# Patient Record
Sex: Female | Born: 1990 | Race: White | Hispanic: No | Marital: Married | Smoking: Never smoker
Health system: Southern US, Community
[De-identification: ages and names within clinical notes are randomized; demographics above are authoritative.]

## PROBLEM LIST (undated history)

## (undated) DIAGNOSIS — N189 Chronic kidney disease, unspecified: Secondary | ICD-10-CM

---

## 2012-09-15 NOTE — L&D Delivery Note (Signed)
Delivery Note At 3:53 AM a viable and healthy female was delivered via Vaginal, Spontaneous Delivery (Presentation: Right Occiput Anterior).  APGAR: 9, 9; weight .   Placenta status: Intact, Spontaneous.  Cord: 3 vessels with the following complications: None.  Cord pH: n/a  Anesthesia: Epidural  Episiotomy: None Lacerations: Labial (bilat) Suture Repair: 4.0 monocryl  Est. Blood Loss (mL): 400 *Due to increased bleeding after delivery pt given of cytotec rectally.  Mom to postpartum.  Baby to nursery-stable.  [facility] 03/05/2013, 5:05 AM

## 2012-09-15 NOTE — L&D Delivery Note (Signed)
Attestation of Attending Supervision of Advanced Practitioner (CNM/NP): Evaluation and management procedures were performed by the Advanced Practitioner under my supervision and collaboration. I have reviewed the Advanced Practitioner's note and chart, and I agree with the management and plan.  Efraim Vanallen H. 10:39 PM   

## 2012-11-04 LAB — OB RESULTS CONSOLE ABO/RH

## 2012-11-04 LAB — OB RESULTS CONSOLE HIV ANTIBODY (ROUTINE TESTING): HIV: NONREACTIVE

## 2012-11-04 LAB — OB RESULTS CONSOLE RPR: RPR: NONREACTIVE

## 2012-11-08 LAB — OB RESULTS CONSOLE GC/CHLAMYDIA: Gonorrhea: NEGATIVE

## 2012-12-06 LAB — GLUCOSE TOLERANCE, 1 HOUR (50G) W/O FASTING: Glucose, 1 Hour GTT: 131

## 2013-02-15 ENCOUNTER — Encounter: Payer: Self-pay | Admitting: *Deleted

## 2013-02-25 ENCOUNTER — Telehealth: Payer: Self-pay | Admitting: *Deleted

## 2013-02-25 NOTE — Telephone Encounter (Signed)
Pt left a message requesting that someone call her back. She just has a few questions.

## 2013-03-02 NOTE — Telephone Encounter (Signed)
Called pt and phone rang and then stated "that # you have dialed is incorrect please try call again."  Pt has appt scheduled with Dr. Marice Potter on 03/08/13 @ 0800.

## 2013-03-04 ENCOUNTER — Encounter (HOSPITAL_COMMUNITY): Payer: Self-pay | Admitting: Anesthesiology

## 2013-03-04 ENCOUNTER — Encounter (HOSPITAL_COMMUNITY): Payer: Self-pay | Admitting: *Deleted

## 2013-03-04 ENCOUNTER — Encounter (HOSPITAL_COMMUNITY): Payer: Self-pay

## 2013-03-04 ENCOUNTER — Inpatient Hospital Stay (HOSPITAL_COMMUNITY): Payer: Medicaid Other | Admitting: Anesthesiology

## 2013-03-04 ENCOUNTER — Inpatient Hospital Stay (HOSPITAL_COMMUNITY)
Admission: AD | Admit: 2013-03-04 | Discharge: 2013-03-06 | DRG: 775 | Disposition: A | Discharge status: Home / Self Care | Payer: Medicaid Other | Source: Ambulatory Visit | Attending: Obstetrics & Gynecology | Admitting: Obstetrics & Gynecology

## 2013-03-04 ENCOUNTER — Inpatient Hospital Stay (HOSPITAL_COMMUNITY)
Admission: AD | Admit: 2013-03-04 | Discharge: 2013-03-04 | Disposition: A | Discharge status: Home / Self Care | Payer: Medicaid Other | Source: Ambulatory Visit | Attending: Obstetrics and Gynecology | Admitting: Obstetrics and Gynecology

## 2013-03-04 DIAGNOSIS — IMO0001 Reserved for inherently not codable concepts without codable children: Secondary | ICD-10-CM

## 2013-03-04 DIAGNOSIS — O479 False labor, unspecified: Secondary | ICD-10-CM | POA: Insufficient documentation

## 2013-03-04 HISTORY — DX: Chronic kidney disease, unspecified: N18.9

## 2013-03-04 LAB — CBC
HCT: 34.4 % — ABNORMAL LOW (ref 36.0–46.0)
Hemoglobin: 11.9 g/dL — ABNORMAL LOW (ref 12.0–15.0)
MCHC: 34.6 g/dL (ref 30.0–36.0)
RBC: 4.38 MIL/uL (ref 3.87–5.11)

## 2013-03-04 LAB — RPR: RPR Ser Ql: NONREACTIVE

## 2013-03-04 LAB — OB RESULTS CONSOLE GBS: GBS: NEGATIVE

## 2013-03-04 LAB — GROUP B STREP BY PCR: Group B strep by PCR: NEGATIVE

## 2013-03-04 MED ORDER — PHENYLEPHRINE 40 MCG/ML (10ML) SYRINGE FOR IV PUSH (FOR BLOOD PRESSURE SUPPORT)
80.0000 ug | PREFILLED_SYRINGE | INTRAVENOUS | Status: DC | PRN
  Filled 2013-03-04: qty 2

## 2013-03-04 MED ORDER — ONDANSETRON HCL 4 MG/2ML IJ SOLN: 4.0000 mg | Freq: Four times a day (QID) | INTRAMUSCULAR | Status: DC | PRN

## 2013-03-04 MED ORDER — EPHEDRINE 5 MG/ML INJ
10.0000 mg | INTRAVENOUS | Status: DC | PRN
  Filled 2013-03-04: qty 2

## 2013-03-04 MED ORDER — OXYTOCIN 40 UNITS IN LACTATED RINGERS INFUSION - SIMPLE MED: 62.5000 mL/h | INTRAVENOUS | Status: DC

## 2013-03-04 MED ORDER — DIPHENHYDRAMINE HCL 50 MG/ML IJ SOLN: 12.5000 mg | INTRAMUSCULAR | Status: DC | PRN

## 2013-03-04 MED ORDER — LACTATED RINGERS IV SOLN
INTRAVENOUS | Status: DC
  Administered 2013-03-04 – 2013-03-05 (×3): via INTRAVENOUS

## 2013-03-04 MED ORDER — LACTATED RINGERS IV SOLN: 500.0000 mL | INTRAVENOUS | Status: DC | PRN

## 2013-03-04 MED ORDER — NALBUPHINE SYRINGE 5 MG/0.5 ML
5.0000 mg | INJECTION | INTRAMUSCULAR | Status: DC | PRN
  Filled 2013-03-04: qty 0.5

## 2013-03-04 MED ORDER — EPHEDRINE 5 MG/ML INJ
10.0000 mg | INTRAVENOUS | Status: DC | PRN
  Filled 2013-03-04: qty 4
  Filled 2013-03-04: qty 2

## 2013-03-04 MED ORDER — OXYCODONE-ACETAMINOPHEN 5-325 MG PO TABS: 1.0000 | ORAL_TABLET | ORAL | Status: DC | PRN

## 2013-03-04 MED ORDER — OXYTOCIN 40 UNITS IN LACTATED RINGERS INFUSION - SIMPLE MED: 1.0000 m[IU]/min | INTRAVENOUS | Status: DC

## 2013-03-04 MED ORDER — IBUPROFEN 600 MG PO TABS
600.0000 mg | ORAL_TABLET | Freq: Four times a day (QID) | ORAL | Status: DC | PRN
  Administered 2013-03-05: 600 mg via ORAL
  Filled 2013-03-04: qty 1

## 2013-03-04 MED ORDER — PHENYLEPHRINE 40 MCG/ML (10ML) SYRINGE FOR IV PUSH (FOR BLOOD PRESSURE SUPPORT)
80.0000 ug | PREFILLED_SYRINGE | INTRAVENOUS | Status: DC | PRN
  Filled 2013-03-04: qty 2
  Filled 2013-03-04: qty 5

## 2013-03-04 MED ORDER — FENTANYL 2.5 MCG/ML BUPIVACAINE 1/10 % EPIDURAL INFUSION (WH - ANES)
INTRAMUSCULAR | Status: DC | PRN
  Administered 2013-03-04: 14 mL/h via EPIDURAL

## 2013-03-04 MED ORDER — LIDOCAINE HCL (PF) 1 % IJ SOLN
30.0000 mL | INTRAMUSCULAR | Status: DC | PRN
  Filled 2013-03-04 (×3): qty 30

## 2013-03-04 MED ORDER — ACETAMINOPHEN 325 MG PO TABS: 650.0000 mg | ORAL_TABLET | ORAL | Status: DC | PRN

## 2013-03-04 MED ORDER — OXYTOCIN BOLUS FROM INFUSION
500.0000 mL | INTRAVENOUS | Status: DC
  Administered 2013-03-05: 500 mL via INTRAVENOUS

## 2013-03-04 MED ORDER — LACTATED RINGERS IV SOLN: 500.0000 mL | Freq: Once | INTRAVENOUS | Status: DC

## 2013-03-04 MED ORDER — FENTANYL 2.5 MCG/ML BUPIVACAINE 1/10 % EPIDURAL INFUSION (WH - ANES)
14.0000 mL/h | INTRAMUSCULAR | Status: DC | PRN
  Administered 2013-03-04: 14 mL/h via EPIDURAL
  Filled 2013-03-04 (×2): qty 125

## 2013-03-04 MED ORDER — CITRIC ACID-SODIUM CITRATE 334-500 MG/5ML PO SOLN: 30.0000 mL | ORAL | Status: DC | PRN

## 2013-03-04 MED ORDER — TERBUTALINE SULFATE 1 MG/ML IJ SOLN: 0.2500 mg | Freq: Once | INTRAMUSCULAR | Status: AC | PRN

## 2013-03-04 MED ORDER — LIDOCAINE HCL (PF) 1 % IJ SOLN
INTRAMUSCULAR | Status: DC | PRN
  Administered 2013-03-04 (×2): 8 mL

## 2013-03-04 NOTE — Progress Notes (Signed)
   Subjective: Reports continued comfort.  Declines pitocin, consents to AROM  Objective: BP 102/69  Pulse 99  Temp(Src) 98.3 F (36.8 C) (Oral)  Resp 20  Ht 5\' 4"  (1.626 m)  Wt 65.499 kg (144 lb 6.4 oz)  BMI 24.77 kg/m2  SpO2 100%  LMP 05/27/2012      FHT:  FHR: 120's bpm, variability: moderate,  accelerations:  Present,  decelerations:  Absent UC:   irregular, every 2-5 minutes SVE:   Dilation: 5 Effacement (%): 80 Station: -1 Exam by:: Dr Adriana Simas  Labs: Lab Results  Component Value Date   WBC 13.7* 03/04/2013   HGB 11.9* 03/04/2013   HCT 34.4* 03/04/2013   MCV 78.5 03/04/2013   PLT 227 03/04/2013    Assessment / Plan: Labor - Minimal Cervix Change  Labor: Labor - Minimal Cervix Change Preeclampsia:  n/a Fetal Wellbeing:  Category I Pain Control:  Epidural I/D:  n/a Anticipated MOD:  NSVD  [facility] 03/04/2013, 8:38 PM

## 2013-03-04 NOTE — Anesthesia Preprocedure Evaluation (Signed)

## 2013-03-04 NOTE — MAU Note (Signed)
Patient states she is having contractions every 3-5 minutes with an increase in vaginal discharge. Patient denies bleeding or leaking and reports good fetal movement. Was seen in MAU in the early am hours and was 2 cm.

## 2013-03-04 NOTE — H&P (Signed)
Margaret Gregory is a 22 y.o. female [redacted]w[redacted]d, G1P0 presenting for spontaneous onset labor. Denies complications during pregnancy.   Maternal Medical History:  Reason for admission: Contractions.  Nausea.  Contractions: Onset was yesterday.   Frequency: regular.   Perceived severity is strong.    Fetal activity: Perceived fetal activity is normal.   Last perceived fetal movement was within the past hour.    Prenatal complications: Bleeding: Reports passing small clot 2 days ago and small amount bloody discharge x 2 days.   Prenatal Complications - Diabetes: none.    OB History   Grav Para Term Preterm Abortions TAB SAB Ect Mult Living   1 0 0 0 0 0 0 0 0 0      Past Medical History  Diagnosis Date  . Chronic kidney disease     caused by apples and hot dogs   Past Surgical History  Procedure Laterality Date  . No past surgeries     Family History: family history is not on file. Social History:  reports that she has never smoked. She does not have any smokeless tobacco history on file. She reports that she does not drink alcohol or use illicit drugs.   Prenatal Transfer Tool  Maternal Diabetes: No Genetic Screening: Normal Maternal Ultrasounds/Referrals: Normal Fetal Ultrasounds or other Referrals:  None Maternal Substance Abuse:  No Significant Maternal Medications:  None Significant Maternal Lab Results:  None Other Comments:  None GBS neg Review of Systems  Constitutional: Negative for fever.  Eyes: Negative for blurred vision and double vision.  Respiratory: Negative for shortness of breath.   Cardiovascular: Negative for chest pain, palpitations and leg swelling.  Gastrointestinal: Negative for nausea, vomiting and diarrhea.  Neurological: Negative for dizziness and headaches.    Dilation: 4 Effacement (%): 80 Station: -2 Exam by:: D. Braycen Burandt CNM Blood pressure 124/70, pulse 92, temperature 98.3 F (36.8 C), temperature source Oral, resp. rate 18, height 5\' 4"   (1.626 m), weight 65.499 kg (144 lb 6.4 oz), last menstrual period 05/27/2012, SpO2 100.00%. Maternal Exam:  Uterine Assessment: Contraction strength is firm.  Contraction duration is 1 minute. Contraction frequency is regular.   Abdomen: Patient reports no abdominal tenderness. Fetal presentation: vertex  Cervix: Cervix evaluated by digital exam.     Physical Exam  Constitutional: She appears well-developed and well-nourished.  Cardiovascular: Normal rate, regular rhythm and normal heart sounds.   No murmur heard. Respiratory: Effort normal and breath sounds normal. No respiratory distress.  GI: Distention: gravid but not otherwise distended.  Musculoskeletal: She exhibits no edema.    Prenatal labs: ABO, Rh: O/Positive/-- (02/20 0000) Antibody: Negative (02/20 0000) Rubella: Immune (02/20 0000) RPR: Nonreactive (02/20 0000)  HBsAg: Negative (02/20 0000)  HIV: Non-reactive (02/20 0000)  GBS:   negative  Dilation: 4 Effacement (%): 80 Cervical Position: Middle Station: -2 Presentation: Vertex Exam by:: D. Addis Tuohy CNM  FHR 130; moderate variability; accelerations: present; decelerations: absent UCs q 2-4 min Assessment/Plan: Ax: [redacted]w[redacted]d, G1P0 latent phase labor  Plan: Admit  Margaret, Gregory  03/04/2013, 4:03 PM

## 2013-03-04 NOTE — MAU Provider Note (Signed)
22 yo G1 at [redacted]w[redacted]d for labor check. Had evaluation here last night and has not slept. UCs stronger. General: crying in apparent pain VE: posterior 4/80//-2 bulging, brown show FHR: 125-130, reactive UCs: palpate moderate q 2-4  Plan: Admit See Admission H&P Danae Orleans, CNM 03/04/2013 4:48 PM

## 2013-03-04 NOTE — MAU Provider Note (Signed)
  History     CSN: [id number]  Arrival date and time: 03/04/13 1122   First Provider Initiated Contact with Patient 03/04/13 1238      Chief Complaint  Patient presents with  . Labor Eval   HPI Margaret Gregory is a 22 y/o female G1P0 [redacted]w[redacted]d presenting to MAU for labor check. Reports contractions 3-5 min apart. Describes small amt "bloody discharge" x 2 days. Denies leakage of fluid. Was in MAU last night complaining of contractions 8-10 min apart, cervix dilated 2 cm; discharged home. Denies RUQ pain, HA, changes in vision.   [place]. Denies complications during pregnancy.   Past Medical History  Diagnosis Date  . Chronic kidney disease     caused by apples and hot dogs    Past Surgical History  Procedure Laterality Date  . No past surgeries      History reviewed. No pertinent family history.  History  Substance Use Topics  . Smoking status: Never Smoker   . Smokeless tobacco: Not on file  . Alcohol Use: No    Allergies:  Allergies  Allergen Reactions  . Levaquin (Levofloxacin) Anaphylaxis  . Apple     "shut down kidneys"    Prescriptions prior to admission  Medication Sig Dispense Refill  . Prenatal Vit-Fe Fumarate-FA (PRENATAL MULTIVITAMIN) TABS Take 1 tablet by mouth daily at 12 noon.        Review of Systems  Constitutional: Negative for fever.  Eyes: Negative for blurred vision and double vision.  Respiratory: Negative for sputum production.   Cardiovascular: Negative for chest pain.  Gastrointestinal: Negative for nausea, vomiting and abdominal pain.  Neurological: Negative for headaches.   Physical Exam   Blood pressure 116/81, pulse 97, temperature 98.6 F (37 C), temperature source Oral, resp. rate 20, height 5\' 4"  (1.626 m), weight 65.499 kg (144 lb 6.4 oz), last menstrual period 05/27/2012, SpO2 100.00%.  Physical Exam  Constitutional: She appears well-developed and well-nourished.  Cardiovascular:  Normal rate, regular rhythm and normal heart sounds.   No murmur heard. Respiratory: Breath sounds normal. No respiratory distress.  GI: Distention: gravid but not otherwise distended. There is no tenderness. There is no rebound and no guarding.  Musculoskeletal: She exhibits no edema.  Skin: Skin is warm and dry.    MAU Course  Procedures  MDM Dilation: 3.5 Effacement (%): 80 Cervical Position: Middle Station: -2 Presentation: Vertex Exam by:: Ninfa Meeker RN; Janeth Rase RN   Assessment and Plan  Ax: G1P0, [redacted]w[redacted]d, latent phase labor  Plan: Re-check cervix in 1 hour Continue to monitor contractions and patient sx   Ardis Hughs 03/04/2013, 12:42 PM  Evaluation and management procedures were performed by PA-S under my supervision/collaboration. Chart reviewed, patient examined by me and I agree with management and plan. Danae Orleans, CNM 03/04/2013 4:49 PM

## 2013-03-04 NOTE — Anesthesia Procedure Notes (Signed)
Epidural Patient location during procedure: OB Start time: 03/04/2013 4:30 PM End time: 03/04/2013 4:35 PM  Staffing Anesthesiologist: Sandrea Hughs Performed by: anesthesiologist   Preanesthetic Checklist Completed: patient identified, surgical consent, pre-op evaluation, timeout performed, IV checked, risks and benefits discussed and monitors and equipment checked  Epidural Patient position: sitting Prep: site prepped and draped and DuraPrep Patient monitoring: continuous pulse ox and blood pressure Approach: midline Injection technique: LOR air  Needle:  Needle type: Tuohy  Needle gauge: 17 G Needle length: 9 cm and 9 Needle insertion depth: 4 cm Catheter type: closed end flexible Catheter size: 19 Gauge Catheter at skin depth: 10 cm Test dose: negative and Other  Assessment Sensory level: T10 Events: blood not aspirated, injection not painful, no injection resistance, negative IV test and no paresthesia  Additional Notes Reason for block:procedure for pain

## 2013-03-04 NOTE — MAU Note (Signed)
PT SAYS SHE HAS MOVED HERE FROM [place]- NO [facility] HERE-  SAYS HERE RECORDS HAVE BEEN TRANSFERRED HERE.   NO VE THERE.  DENIES SROM, HAS BROWNISH D/C, DENIES HSV AND MRSA.  DOES NOT KNOW ABOUT GBS.     HERE  FOR LABOR CHECK.

## 2013-03-04 NOTE — Progress Notes (Signed)
Margaret Gregory is a 22 y.o. G1P0000 at [redacted]w[redacted]d by LMP admitted for SOL.  Subjective: Comfortable with epidura.  Objective: BP 102/62  Pulse 78  Temp(Src) 98.5 F (36.9 C) (Oral)  Resp 18  Ht 5\' 4"  (1.626 m)  Wt 65.499 kg (144 lb 6.4 oz)  BMI 24.77 kg/m2  SpO2 100%  LMP 05/27/2012      FHT:  FHR: 130 bpm, variability: moderate,  accelerations:  Present,  decelerations:  Absent UC:   regular, every 2-4 minutes SVE:   Dilation: 5 Effacement (%): 80 Station: -1 Exam by:: Dr Margaret Gregory  Labs: Lab Results  Component Value Date   WBC 13.7* 03/04/2013   HGB 11.9* 03/04/2013   HCT 34.4* 03/04/2013   MCV 78.5 03/04/2013   PLT 227 03/04/2013    Assessment / Plan: Spontaneous labor, progressing normally  Labor: Progressing normally Fetal Wellbeing:  Category I Pain Control:  Epidural Anticipated MOD:  NSVD  Margaret Gregory Other 03/04/2013, 6:42 PM

## 2013-03-05 ENCOUNTER — Encounter (HOSPITAL_COMMUNITY): Payer: Self-pay | Admitting: *Deleted

## 2013-03-05 MED ORDER — PRENATAL MULTIVITAMIN CH
1.0000 | ORAL_TABLET | Freq: Every day | ORAL | Status: DC
  Administered 2013-03-05 – 2013-03-06 (×2): 1 via ORAL
  Filled 2013-03-05 (×2): qty 1

## 2013-03-05 MED ORDER — IBUPROFEN 600 MG PO TABS
600.0000 mg | ORAL_TABLET | Freq: Four times a day (QID) | ORAL | Status: DC
  Administered 2013-03-05 – 2013-03-06 (×6): 600 mg via ORAL
  Filled 2013-03-05 (×6): qty 1

## 2013-03-05 MED ORDER — BENZOCAINE-MENTHOL 20-0.5 % EX AERO
1.0000 "application " | INHALATION_SPRAY | CUTANEOUS | Status: DC | PRN
  Administered 2013-03-05: 1 via TOPICAL
  Filled 2013-03-05: qty 56

## 2013-03-05 MED ORDER — DIPHENHYDRAMINE HCL 25 MG PO CAPS: 25.0000 mg | ORAL_CAPSULE | Freq: Four times a day (QID) | ORAL | Status: DC | PRN

## 2013-03-05 MED ORDER — SENNOSIDES-DOCUSATE SODIUM 8.6-50 MG PO TABS
2.0000 | ORAL_TABLET | Freq: Every day | ORAL | Status: DC
  Administered 2013-03-05: 2 via ORAL

## 2013-03-05 MED ORDER — DIBUCAINE 1 % RE OINT: 1.0000 "application " | TOPICAL_OINTMENT | RECTAL | Status: DC | PRN

## 2013-03-05 MED ORDER — OXYTOCIN 40 UNITS IN LACTATED RINGERS INFUSION - SIMPLE MED
INTRAVENOUS | Status: AC
  Filled 2013-03-05: qty 1000

## 2013-03-05 MED ORDER — ACETAMINOPHEN 500 MG PO TABS
1000.0000 mg | ORAL_TABLET | Freq: Once | ORAL | Status: AC
  Administered 2013-03-05: 1000 mg via ORAL

## 2013-03-05 MED ORDER — ZOLPIDEM TARTRATE 5 MG PO TABS: 5.0000 mg | ORAL_TABLET | Freq: Every evening | ORAL | Status: DC | PRN

## 2013-03-05 MED ORDER — WITCH HAZEL-GLYCERIN EX PADS: 1.0000 "application " | MEDICATED_PAD | CUTANEOUS | Status: DC | PRN

## 2013-03-05 MED ORDER — SIMETHICONE 80 MG PO CHEW: 80.0000 mg | CHEWABLE_TABLET | ORAL | Status: DC | PRN

## 2013-03-05 MED ORDER — OXYCODONE-ACETAMINOPHEN 5-325 MG PO TABS
1.0000 | ORAL_TABLET | ORAL | Status: DC | PRN
  Administered 2013-03-05 – 2013-03-06 (×3): 1 via ORAL
  Filled 2013-03-05 (×3): qty 1

## 2013-03-05 MED ORDER — MISOPROSTOL 200 MCG PO TABS
ORAL_TABLET | ORAL | Status: AC
  Administered 2013-03-05: 800 ug
  Filled 2013-03-05: qty 4

## 2013-03-05 MED ORDER — LANOLIN HYDROUS EX OINT: TOPICAL_OINTMENT | CUTANEOUS | Status: DC | PRN

## 2013-03-05 MED ORDER — TETANUS-DIPHTH-ACELL PERTUSSIS 5-2.5-18.5 LF-MCG/0.5 IM SUSP: 0.5000 mL | Freq: Once | INTRAMUSCULAR | Status: DC

## 2013-03-05 MED ORDER — ONDANSETRON HCL 4 MG/2ML IJ SOLN: 4.0000 mg | INTRAMUSCULAR | Status: DC | PRN

## 2013-03-05 MED ORDER — ONDANSETRON HCL 4 MG PO TABS: 4.0000 mg | ORAL_TABLET | ORAL | Status: DC | PRN

## 2013-03-05 NOTE — Progress Notes (Addendum)
Post Partum Day 1 Subjective: up ad lib and voiding  Objective: Blood pressure 107/70, pulse 90, temperature 99.6 F (37.6 C), temperature source Oral, resp. rate 20, height 5\' 4"  (1.626 m), weight 144 lb 6.4 oz (65.499 kg), last menstrual period 05/27/2012, SpO2 100.00%, unknown if currently breastfeeding.  Physical Exam:  Filed Vitals:   03/05/13 0446 03/05/13 0501 03/05/13 0516 03/05/13 0540  BP: 110/79 103/67 92/77 107/70  Pulse:  106 97 90  Temp:   99.7 F (37.6 C) 99.6 F (37.6 C)  TempSrc:    Oral  Resp:  18 20 20   Height:      Weight:      SpO2:        General: alert, cooperative and no distress Lochia: appropriate Uterine Fundus: firm,  Incision: n/a DVT Evaluation: No evidence of DVT seen on physical exam.   Recent Labs  03/04/13 1530  HGB 11.9*  HCT 34.4*    Assessment/Plan: Pt may want to go home today after baby 24 hours old. Condoms Breastfeeding   LOS: 1 day   Margaret Gregory H. 03/05/2013, 6:42 AM

## 2013-03-05 NOTE — Anesthesia Postprocedure Evaluation (Signed)
  Anesthesia Post-op Note  Patient: Margaret Gregory  Procedure(s) Performed: * No procedures listed *  Patient Location: Mother/Baby  Anesthesia Type:Epidural  Level of Consciousness: awake  Airway and Oxygen Therapy: Patient Spontanous Breathing  Post-op Pain: mild  Post-op Assessment: Patient's Cardiovascular Status Stable and Respiratory Function Stable  Post-op Vital Signs: stable  Complications: No apparent anesthesia complications

## 2013-03-05 NOTE — Progress Notes (Signed)
   Subjective: Pt reports increase pressure.  Objective: BP 101/63  Pulse 93  Temp(Src) 98.3 F (36.8 C) (Oral)  Resp 20  Ht 5\' 4"  (1.626 m)  Wt 65.499 kg (144 lb 6.4 oz)  BMI 24.77 kg/m2  SpO2 100%  LMP 05/27/2012      FHT:  FHR: 120's bpm, variability: marked,  accelerations:  Present,  decelerations:  Present variable decels. Occasional variable with return to baseline.   UC:   regular, every 2-3 minutes SVE:   Dilation: 8 Effacement (%): 100 Station: 0 Exam by:: foley,rn  Labs: Lab Results  Component Value Date   WBC 13.7* 03/04/2013   HGB 11.9* 03/04/2013   HCT 34.4* 03/04/2013   MCV 78.5 03/04/2013   PLT 227 03/04/2013    Assessment / Plan: Augmentation of labor, progressing well  Labor: Progressing normally Preeclampsia:  n/a Fetal Wellbeing:  Category II Pain Control:  Epidural I/D:  GBS neg Anticipated MOD:  NSVD  [facility] 03/05/2013, 12:08 AM

## 2013-03-06 DIAGNOSIS — IMO0001 Reserved for inherently not codable concepts without codable children: Secondary | ICD-10-CM

## 2013-03-06 LAB — CULTURE, BETA STREP (GROUP B ONLY)

## 2013-03-06 LAB — CBC
Hemoglobin: 8 g/dL — ABNORMAL LOW (ref 12.0–15.0)
RBC: 2.95 MIL/uL — ABNORMAL LOW (ref 3.87–5.11)
WBC: 9.9 10*3/uL (ref 4.0–10.5)

## 2013-03-06 MED ORDER — IBUPROFEN 600 MG PO TABS: 600.0000 mg | ORAL_TABLET | Freq: Four times a day (QID) | ORAL | Status: DC | PRN

## 2013-03-06 NOTE — Discharge Summary (Signed)
Attestation of Attending Supervision of Advanced Practitioner (CNM/NP): Evaluation and management procedures were performed by the Advanced Practitioner under my supervision and collaboration. I have reviewed the Advanced Practitioner's note and chart, and I agree with the management and plan.  Majid Mccravy H. 11:45 AM

## 2013-03-06 NOTE — H&P (Signed)
Attestation of Attending Supervision of Advanced Practitioner (CNM/NP): Evaluation and management procedures were performed by the Advanced Practitioner under my supervision and collaboration. I have reviewed the Advanced Practitioner's note and chart, and I agree with the management and plan.  Aigner Horseman H. 11:45 AM   

## 2013-03-06 NOTE — Discharge Summary (Signed)
Obstetric Discharge Summary Reason for Admission: onset of labor Prenatal Procedures: none Intrapartum Procedures: spontaneous vaginal delivery Postpartum Procedures: none Complications-Operative and Postpartum: labial laceration Hemoglobin  Date Value Range Status  03/06/2013 8.0* 12.0 - 15.0 g/dL Final     DELTA CHECK NOTED     REPEATED TO VERIFY  12/06/2012 12.0   Final     HCT  Date Value Range Status  03/06/2013 23.4* 36.0 - 46.0 % Final  12/06/2012 36   Final    Physical Exam:  General: alert, cooperative and appears stated age Lochia: appropriate Uterine Fundus: firm Incision: none DVT Evaluation: No evidence of DVT seen on physical exam.  Discharge Diagnoses: Term Pregnancy-delivered  Discharge Information: Date: 03/06/2013 Activity: pelvic rest Diet: routine Medications: PNV and Ibuprofen Condition: stable and improved Instructions: Follow-up in 6 weeks for post-partum visit with OB provider.  Follow-up with lacation within 2 days post-discharge. Discharge to: home Follow-up Information   Follow up with Follow-up with primary OB Provider in [place] at 6 weeks post-partum.  In 6 weeks.      Follow up with [facility] In 2 days.   Contact information:   [address] [id number] [place] [address] [phone]      Newborn Data: Live born female  Birth Weight: 7 lb 9 oz (3430 g) APGAR: 9, 9  Home with family.Margaret Gregory 03/06/2013, 8:34 AM  Hospital Course: Margaret Gregory is a 22 y.o. female [redacted]w[redacted]d, G1P0 presenting for spontaneous onset labor. Denies complications during pregnancy. She received care in [place] before moving here.  She was 4cm upon admission. Delivery Note  At 3:53 AM a viable and healthy female was delivered via Vaginal, Spontaneous Delivery (Presentation: Right Occiput Anterior). APGAR: 9, 9; weight .  Placenta status: Intact, Spontaneous. Cord: 3 vessels with the following  complications: None. Cord pH: n/a  Anesthesia: Epidural  Episiotomy: None  Lacerations: Labial (bilat)  Suture Repair: 4.0 monocryl  Est. Blood Loss (mL): 400  *Due to increased bleeding after delivery pt given of cytotec rectally.  Mom to postpartum. Baby to nursery-stable.  [facility]  03/05/2013, 5:05 AM  She has done well postpartum and is requesting early discharge.  Will have her followup in our clinic.   Seen also by me Agree with note Wynelle Bourgeois CNM

## 2013-03-07 NOTE — Progress Notes (Signed)
Post discharge chart review completed.  

## 2013-03-08 ENCOUNTER — Encounter: Payer: Self-pay | Admitting: Obstetrics & Gynecology

## 2013-03-10 ENCOUNTER — Ambulatory Visit (HOSPITAL_COMMUNITY)
Admit: 2013-03-10 | Discharge: 2013-03-10 | Disposition: A | Discharge status: Home / Self Care | Payer: Medicaid Other | Attending: Obstetrics & Gynecology | Admitting: Obstetrics & Gynecology

## 2013-03-10 NOTE — Lactation Note (Signed)
Adult Lactation Consultation Outpatient Visit Note  Patient Name: Margaret Gregory(mother)  BABY: [name] Date of Birth: [birth year]                         DOB: [birth year] Gestational Age at Delivery: 53.2             BIRTH WEIGHT: 7-9 Type of Delivery: NVD                               DISCHARGE WEIGHT: 7-0.5                                                                    WEIGHT TODAY:7-7.4 Breastfeeding History: Frequency of Breastfeeding: every 1-2 hours Length of Feeding: 45 minutes total Voids: 6+ Stools: 6+ orange  Supplementing / Method:NONE Pumping:  Type of Pump:PUMP IN STYLE   Frequency:  Volume:    Comments:    Consultation Evaluation:  Mom and 5 day old infant here for feeding assessment.  Mom has history of difficult latch and sore nipples in the hospital. She states her milk came in yesterday and she was very full but better today.  Pumped once for comfort.  Nipple tips have moist healing scabs on tips.  Comfort gels given with instructions.  Baby has gained 7 oz in 3 days!  Observed mom latch baby to right breast using football hold and cross cradle on left using 20 mm nipple shield. Baby latches easily and deeply and nurses actively.  Baby transferred 54 mls and came off breast content.  Encouraged to call for any concerns.  Initial Feeding Assessment:15 minutes each breast Pre-feed [id number] Post-feed [id number] Amount Transferred:54 ml Comments:  Additional Feeding Assessment: Pre-feed Weight: Post-feed Weight: Amount Transferred: Comments:  Additional Feeding Assessment: Pre-feed Weight: Post-feed Weight: Amount Transferred: Comments:  Total Breast milk Transferred this Visit: 54 mls Total Supplement Given: none  Additional Interventions:   Follow-Up  MD ON 03/16/13, WILL CALL LC OFFICE PRN      Hansel Feinstein 03/10/2013, 12:07 PM

## 2013-03-14 NOTE — MAU Provider Note (Signed)
Attestation of Attending Supervision of Advanced Practitioner (CNM/NP): Evaluation and management procedures were performed by the Advanced Practitioner under my supervision and collaboration. I have reviewed the Advanced Practitioner's note and chart, and I agree with the management and plan.  LEGGETT,KELLY H. 10:39 PM   

## 2013-03-14 NOTE — MAU Provider Note (Signed)
Attestation of Attending Supervision of Advanced Practitioner (CNM/NP): Evaluation and management procedures were performed by the Advanced Practitioner under my supervision and collaboration. I have reviewed the Advanced Practitioner's note and chart, and I agree with the management and plan.  Kayleah Appleyard H. 10:38 PM   

## 2013-03-30 ENCOUNTER — Ambulatory Visit: Payer: Medicaid Other | Admitting: Obstetrics and Gynecology

## 2013-04-11 ENCOUNTER — Ambulatory Visit (HOSPITAL_COMMUNITY)
Admission: AD | Admit: 2013-04-11 | Discharge: 2013-04-14 | Disposition: A | Discharge status: Home / Self Care | Payer: Medicaid Other | Source: Ambulatory Visit | Attending: General Surgery | Admitting: General Surgery

## 2013-04-11 ENCOUNTER — Inpatient Hospital Stay (HOSPITAL_COMMUNITY): Payer: Medicaid Other

## 2013-04-11 ENCOUNTER — Encounter (HOSPITAL_COMMUNITY): Payer: Self-pay | Admitting: *Deleted

## 2013-04-11 DIAGNOSIS — K8020 Calculus of gallbladder without cholecystitis without obstruction: Secondary | ICD-10-CM

## 2013-04-11 DIAGNOSIS — O9089 Other complications of the puerperium, not elsewhere classified: Secondary | ICD-10-CM | POA: Insufficient documentation

## 2013-04-11 DIAGNOSIS — R748 Abnormal levels of other serum enzymes: Secondary | ICD-10-CM | POA: Insufficient documentation

## 2013-04-11 DIAGNOSIS — K8019 Calculus of gallbladder with other cholecystitis with obstruction: Secondary | ICD-10-CM | POA: Insufficient documentation

## 2013-04-11 LAB — COMPREHENSIVE METABOLIC PANEL
Albumin: 3.9 g/dL (ref 3.5–5.2)
Alkaline Phosphatase: 200 U/L — ABNORMAL HIGH (ref 39–117)
BUN: 6 mg/dL (ref 6–23)
Chloride: 103 mEq/L (ref 96–112)
Potassium: 3.4 mEq/L — ABNORMAL LOW (ref 3.5–5.1)
Total Bilirubin: 3.4 mg/dL — ABNORMAL HIGH (ref 0.3–1.2)

## 2013-04-11 LAB — URINALYSIS, ROUTINE W REFLEX MICROSCOPIC
Glucose, UA: NEGATIVE mg/dL
Leukocytes, UA: NEGATIVE
Protein, ur: NEGATIVE mg/dL
Specific Gravity, Urine: 1.015 (ref 1.005–1.030)
pH: 6 (ref 5.0–8.0)

## 2013-04-11 LAB — CBC
HCT: 33.2 % — ABNORMAL LOW (ref 36.0–46.0)
Hemoglobin: 10.8 g/dL — ABNORMAL LOW (ref 12.0–15.0)
RBC: 4.3 MIL/uL (ref 3.87–5.11)
RDW: 14.3 % (ref 11.5–15.5)
WBC: 4.8 10*3/uL (ref 4.0–10.5)

## 2013-04-11 LAB — AMYLASE: Amylase: 36 U/L (ref 0–105)

## 2013-04-11 MED ORDER — ONDANSETRON HCL 4 MG/2ML IJ SOLN
4.0000 mg | Freq: Four times a day (QID) | INTRAMUSCULAR | Status: DC | PRN
Start: 2013-04-11 — End: 2013-04-14
  Administered 2013-04-12 – 2013-04-13 (×2): 4 mg via INTRAVENOUS
  Filled 2013-04-11 (×3): qty 2

## 2013-04-11 MED ORDER — DIPHENHYDRAMINE HCL 12.5 MG/5ML PO ELIX: 12.5000 mg | ORAL_SOLUTION | Freq: Four times a day (QID) | ORAL | Status: DC | PRN

## 2013-04-11 MED ORDER — SODIUM CHLORIDE 0.9 % IV SOLN
3.0000 g | Freq: Four times a day (QID) | INTRAVENOUS | Status: DC
  Administered 2013-04-11 – 2013-04-12 (×3): 3 g via INTRAVENOUS
  Filled 2013-04-11 (×4): qty 3

## 2013-04-11 MED ORDER — HYDROMORPHONE HCL PF 1 MG/ML IJ SOLN
0.5000 mg | INTRAMUSCULAR | Status: DC | PRN
  Administered 2013-04-12 – 2013-04-13 (×3): 1 mg via INTRAVENOUS
  Filled 2013-04-11 (×4): qty 1

## 2013-04-11 MED ORDER — KCL IN DEXTROSE-NACL 40-5-0.45 MEQ/L-%-% IV SOLN
INTRAVENOUS | Status: DC
  Administered 2013-04-11: 16:00:00 via INTRAVENOUS
  Filled 2013-04-11 (×4): qty 1000

## 2013-04-11 MED ORDER — DIPHENHYDRAMINE HCL 50 MG/ML IJ SOLN
12.5000 mg | Freq: Four times a day (QID) | INTRAMUSCULAR | Status: DC | PRN
  Filled 2013-04-11: qty 1

## 2013-04-11 NOTE — ED Notes (Signed)
Pt post partum x 1 week, went to [facility] hospital with abdominal pain and was diagnosed with kidney stones

## 2013-04-11 NOTE — ED Notes (Signed)
[id number]<AV> Expected date:<BR> Expected time:<BR> Means of arrival:<BR> Comments:<BR> Hold for transfer [facility]

## 2013-04-11 NOTE — ED Notes (Signed)
AC is checking bed placement for pt

## 2013-04-11 NOTE — H&P (Signed)
Margaret Gregory is an 22 y.o. female.     Chief Complaint: Abdominal pain, nausea and vomiting since 04/10/13. HPI: Healthy 21 y/o 5 weeks post partum, who has been doing well since her normal Vaginal delivery.  Saturday she went to a barbeque and woke up early Sunday AM with pain followed by nausea and vomiting.  She has not been able eat or drink since then.  Last PO was about 5 PM yesterday. She presented to [facility] today with abdominal pain, nausea and vomiting.  Unable to keep anything down. Work up shows a normal WBC, H/H is improved since her delivery.  LFT's are elevated with her T. Bilirubin up to 3.4  AST 452, ALT 538.   Abdominal US shows dependant gallstones, no gallbladder wall thickening, no pericholecystic fluid.  CBD was 6mm with intrahepatic ductal filling defect identified.  We are ask to see.  She was transferred here to [facility] for evaluation and treatment.  Past Medical History  Diagnosis Date  . 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 No past surgeries      History reviewed. No pertinent family history. Both parents and 4 siblings in good health. Social History:  reports that she has never smoked. She does not have any smokeless tobacco history on file. She reports that she does not drink alcohol or use illicit drugs.  Allergies:  Allergies  Allergen Reactions  . Levaquin (Levofloxacin) Anaphylaxis  . Apple,   "shut down kidneys"       Hot dogs also causes issues,  Possible nitrate, issue, she says she can eat all beef hot dogs, but not non beef hot dogs       Prior to Admission medications   Medication Sig Start Date End Date Taking? Authorizing Provider  docusate sodium (COLACE) 100 MG capsule Take 100 mg by mouth 2 (two) times daily.   Yes Historical Provider, MD  ibuprofen (ADVIL,MOTRIN) 600 MG tablet Take 1  tablet (600 mg total) by mouth every 6 (six) hours as needed for pain. 03/06/13  Yes Lesly Dukes, MD  Prenatal Vit-Fe Fumarate-FA (PRENATAL MULTIVITAMIN) TABS Take 1 tablet by mouth daily at 12 noon.   Yes Historical Provider, MD     (Not in a hospital admission)  Results for orders placed during the hospital encounter of 04/11/13 (from the past 48 hour(s))  URINALYSIS, ROUTINE W REFLEX MICROSCOPIC     Status: Abnormal   Collection Time    04/11/13  6:40 AM      Result Value Range   Color, Urine YELLOW  YELLOW   APPearance CLEAR  CLEAR   Specific Gravity, Urine 1.015  1.005 - 1.030   pH 6.0  5.0 - 8.0   Glucose, UA NEGATIVE  NEGATIVE mg/dL   Hgb urine dipstick NEGATIVE  NEGATIVE   Bilirubin Urine SMALL (*) NEGATIVE   Ketones, ur NEGATIVE  NEGATIVE mg/dL   Protein, ur NEGATIVE  NEGATIVE mg/dL   Urobilinogen, UA 1.0  0.0 - 1.0 mg/dL   Nitrite NEGATIVE  NEGATIVE   Leukocytes, UA NEGATIVE  NEGATIVE   Comment: MICROSCOPIC NOT DONE ON URINES WITH NEGATIVE PROTEIN, BLOOD, LEUKOCYTES, NITRITE, OR GLUCOSE <1000 mg/dL.  CBC     Status: Abnormal   Collection Time    04/11/13  6:56 AM  Result Value Range   WBC 4.8  4.0 - 10.5 K/uL   RBC 4.30  3.87 - 5.11 MIL/uL   Hemoglobin 10.8 (*) 12.0 - 15.0 g/dL   HCT 16.1 (*) 09.6 - 04.5 %   MCV 77.2 (*) 78.0 - 100.0 fL   MCH 25.1 (*) 26.0 - 34.0 pg   MCHC 32.5  30.0 - 36.0 g/dL   RDW 40.9  81.1 - 91.4 %   Platelets 269  150 - 400 K/uL  COMPREHENSIVE METABOLIC PANEL     Status: Abnormal   Collection Time    04/11/13  6:56 AM      Result Value Range   Sodium 140  135 - 145 mEq/L   Potassium 3.4 (*) 3.5 - 5.1 mEq/L   Chloride 103  96 - 112 mEq/L   CO2 27  19 - 32 mEq/L   Glucose, Bld 106 (*) 70 - 99 mg/dL   BUN 6  6 - 23 mg/dL   Creatinine, Ser 7.82  0.50 - 1.10 mg/dL   Calcium 9.5  8.4 - 95.6 mg/dL   Total Protein 7.1  6.0 - 8.3 g/dL   Albumin 3.9  3.5 - 5.2 g/dL   AST 213 (*) 0 - 37 U/L   ALT 453 (*) 0 - 35 U/L   Alkaline  Phosphatase 200 (*) 39 - 117 U/L   Total Bilirubin 3.4 (*) 0.3 - 1.2 mg/dL   GFR calc non Af Amer >90  >90 mL/min   GFR calc Af Amer >90  >90 mL/min   Comment:            The eGFR has been calculated     using the CKD EPI equation.     This calculation has not been     validated in all clinical     situations.     eGFR's persistently     <90 mL/min signify     possible Chronic Kidney Disease.  AMYLASE     Status: None   Collection Time    04/11/13  6:56 AM      Result Value Range   Amylase 36  0 - 105 U/L  LIPASE, BLOOD     Status: None   Collection Time    04/11/13  6:56 AM      Result Value Range   Lipase 35  11 - 59 U/L   US Abdomen Complete  04/11/2013   *RADIOLOGY REPORT*  Clinical Data:  Nausea, vomiting, epigastric pain, elevated liver function tests  ABDOMINAL ULTRASOUND COMPLETE  Comparison:  None.  Findings:  Gallbladder:  Dependent gallstones noted without gallbladder wall thickening, pericholecystic fluid, or sonographic Murphy's sign.  Common Bile Duct:  Upper limits of normal, 6 mm.  Without intrahepatic ductal filling defect identified.  Liver: No focal mass lesion identified.  Within normal limits in parenchymal echogenicity.  IVC:  Appears normal.  Pancreas:  No abnormality identified.  Spleen:  Within normal limits in size and echotexture.  Right kidney:  Normal in size and parenchymal echogenicity.  No evidence of mass or hydronephrosis.  Left kidney:  Normal in size and parenchymal echogenicity.  No evidence of mass or hydronephrosis.  Abdominal Aorta:  No aneurysm identified.  IMPRESSION: Gallstones without other sonographic evidence for acute cholecystitis.   Original Report Authenticated By: Christiana Pellant, M.D.    Review of Systems  Constitutional: Negative.   HENT: Negative.   Eyes: Negative.   Respiratory: Negative.   Cardiovascular: Negative.   Gastrointestinal:  Positive for heartburn (better since birth of son), nausea, vomiting and abdominal pain (mid  epigastric). Negative for diarrhea, constipation, blood in stool and melena.  Genitourinary: Negative.   Musculoskeletal: Negative.   Skin: Negative.   Neurological: Negative.   Endo/Heme/Allergies: Negative.   Psychiatric/Behavioral: Negative.     Blood pressure 99/57, pulse 64, temperature 98.6 F (37 C), temperature source Oral, resp. rate 18, height 5\' 6"  (1.676 m), weight 57.38 kg (126 lb 8 oz), SpO2 97.00%, unknown if currently breastfeeding. Physical Exam  Constitutional: She is oriented to person, place, and time. She appears well-developed and well-nourished. No distress.  HENT:  Head: Normocephalic and atraumatic.  Nose: Nose normal.  Eyes: Conjunctivae and EOM are normal. Pupils are equal, round, and reactive to light. Right eye exhibits no discharge. Left eye exhibits no discharge. No scleral icterus.  Neck: Normal range of motion. Neck supple. No JVD present. No tracheal deviation present. No thyromegaly present.  Cardiovascular: Normal rate, normal heart sounds and intact distal pulses.  Exam reveals no gallop.   No murmur heard. Irregular rhythm  Respiratory: Effort normal and breath sounds normal. No respiratory distress. She has no wheezes. She has no rales. She exhibits no tenderness.  GI: Soft. Bowel sounds are normal. She exhibits no distension and no mass. There is tenderness (mid epigastric, and minimal tenderness upper mid right abdomen.). There is no rebound and no guarding.  Musculoskeletal: Normal range of motion. She exhibits no edema and no tenderness.  Lymphadenopathy:    She has no cervical adenopathy.  Neurological: She is alert and oriented to person, place, and time. No cranial nerve deficit.  Skin: Skin is warm and dry. No rash noted. She is not diaphoretic. No erythema. No pallor.  Psychiatric: She has a normal mood and affect. Her behavior is normal. Judgment and thought content normal.     Assessment/Plan Nausea, vomiting and abdominal  pain Cholelithiasis with obstruction Post partum 03/05/13 Remote history of Migraines  Plan:  Admit, hydrate, GI to evaluate for ERCP, cholecystectomy to follow when stable.  Recheck labs in AM.  Laiana Fratus 04/11/2013, 11:56 AM

## 2013-04-11 NOTE — MAU Note (Signed)
Abdominal pain and vomiting since 4am yesterday.

## 2013-04-11 NOTE — MAU Provider Note (Signed)
Chart reviewed and agree with management and plan.  

## 2013-04-11 NOTE — Progress Notes (Signed)
Patient states was an 8 out 10 prior to check in to MAU. Felt "much better" post emesis in parking lot. Now states feels "alright".

## 2013-04-11 NOTE — MAU Provider Note (Signed)
History     CSN: [id number]  Arrival date and time: 04/11/13 0630   First Provider Initiated Contact with Patient 04/11/13 0809      Chief Complaint  Patient presents with  . Abdominal Pain  . Emesis   HPI Ms. Margaret Gregory is a 22 y.o. G1P1001 who presents to MAU today with N/V since 4 am yesterday. The patient has had epigastric pain, worse in RUQ rated at 8-9/10 at the worst. She states pain is improved now from arrival. She had SVD on 62114. She denies complications with pregnancy or delivery. She denies fever, diarrhea or sick contacts or chronic medical problems.   OB History   Grav Para Term Preterm Abortions TAB SAB Ect Mult Living   1 1 1  0 0 0 0 0 0 1      Past Medical History  Diagnosis Date  . Chronic kidney disease     caused by apples and hot dogs    Past Surgical History  Procedure Laterality Date  . No past surgeries      History reviewed. No pertinent family history.  History  Substance Use Topics  . Smoking status: Never Smoker   . Smokeless tobacco: Not on file  . Alcohol Use: No    Allergies:  Allergies  Allergen Reactions  . Levaquin (Levofloxacin) Anaphylaxis  . Apple     "shut down kidneys"    Prescriptions prior to admission  Medication Sig Dispense Refill  . docusate sodium (COLACE) 100 MG capsule Take 100 mg by mouth 2 (two) times daily.      Marland Kitchen ibuprofen (ADVIL,MOTRIN) 600 MG tablet Take 1 tablet (600 mg total) by mouth every 6 (six) hours as needed for pain.  30 tablet  0  . Prenatal Vit-Fe Fumarate-FA (PRENATAL MULTIVITAMIN) TABS Take 1 tablet by mouth daily at 12 noon.        Review of Systems  Constitutional: Negative for fever and chills.  Gastrointestinal: Positive for nausea, vomiting and abdominal pain. Negative for diarrhea and constipation.  Genitourinary: Negative for dysuria, urgency and frequency.   Physical Exam   Blood pressure 103/64, pulse 79, temperature 98.6 F (37 C), temperature source Oral, resp. rate  16, height 5\' 6"  (1.676 m), weight 126 lb 8 oz (57.38 kg), unknown if currently breastfeeding.  Physical Exam  Constitutional: She is oriented to person, place, and time. She appears well-developed and well-nourished. No distress.  HENT:  Head: Normocephalic and atraumatic.  Cardiovascular: Normal rate, regular rhythm and normal heart sounds.   Respiratory: Effort normal and breath sounds normal. No respiratory distress.  GI: Soft. Bowel sounds are normal. She exhibits no distension and no mass. There is tenderness (moderate tenderness to palpation of the RUQ and epigastric region). There is no rebound and no guarding.  Neurological: She is alert and oriented to person, place, and time.  Skin: Skin is warm and dry. No erythema.  Psychiatric: She has a normal mood and affect.    Results for orders placed during the hospital encounter of 04/11/13 (from the past 24 hour(s))  URINALYSIS, ROUTINE W REFLEX MICROSCOPIC     Status: Abnormal   Collection Time    04/11/13  6:40 AM      Result Value Range   Color, Urine YELLOW  YELLOW   APPearance CLEAR  CLEAR   Specific Gravity, Urine 1.015  1.005 - 1.030   pH 6.0  5.0 - 8.0   Glucose, UA NEGATIVE  NEGATIVE mg/dL   Hgb urine  dipstick NEGATIVE  NEGATIVE   Bilirubin Urine SMALL (*) NEGATIVE   Ketones, ur NEGATIVE  NEGATIVE mg/dL   Protein, ur NEGATIVE  NEGATIVE mg/dL   Urobilinogen, UA 1.0  0.0 - 1.0 mg/dL   Nitrite NEGATIVE  NEGATIVE   Leukocytes, UA NEGATIVE  NEGATIVE  CBC     Status: Abnormal   Collection Time    04/11/13  6:56 AM      Result Value Range   WBC 4.8  4.0 - 10.5 K/uL   RBC 4.30  3.87 - 5.11 MIL/uL   Hemoglobin 10.8 (*) 12.0 - 15.0 g/dL   HCT 16.1 (*) 09.6 - 04.5 %   MCV 77.2 (*) 78.0 - 100.0 fL   MCH 25.1 (*) 26.0 - 34.0 pg   MCHC 32.5  30.0 - 36.0 g/dL   RDW 40.9  81.1 - 91.4 %   Platelets 269  150 - 400 K/uL  COMPREHENSIVE METABOLIC PANEL     Status: Abnormal   Collection Time    04/11/13  6:56 AM      Result  Value Range   Sodium 140  135 - 145 mEq/L   Potassium 3.4 (*) 3.5 - 5.1 mEq/L   Chloride 103  96 - 112 mEq/L   CO2 27  19 - 32 mEq/L   Glucose, Bld 106 (*) 70 - 99 mg/dL   BUN 6  6 - 23 mg/dL   Creatinine, Ser 7.82  0.50 - 1.10 mg/dL   Calcium 9.5  8.4 - 95.6 mg/dL   Total Protein 7.1  6.0 - 8.3 g/dL   Albumin 3.9  3.5 - 5.2 g/dL   AST 213 (*) 0 - 37 U/L   ALT 453 (*) 0 - 35 U/L   Alkaline Phosphatase 200 (*) 39 - 117 U/L   Total Bilirubin 3.4 (*) 0.3 - 1.2 mg/dL   GFR calc non Af Amer >90  >90 mL/min   GFR calc Af Amer >90  >90 mL/min  AMYLASE     Status: None   Collection Time    04/11/13  6:56 AM      Result Value Range   Amylase 36  0 - 105 U/L  LIPASE, BLOOD     Status: None   Collection Time    04/11/13  6:56 AM      Result Value Range   Lipase 35  11 - 59 U/L   US Abdomen Complete  04/11/2013   *RADIOLOGY REPORT*  Clinical Data:  Nausea, vomiting, epigastric pain, elevated liver function tests  ABDOMINAL ULTRASOUND COMPLETE  Comparison:  None.  Findings:  Gallbladder:  Dependent gallstones noted without gallbladder wall thickening, pericholecystic fluid, or sonographic Murphy's sign.  Common Bile Duct:  Upper limits of normal, 6 mm.  Without intrahepatic ductal filling defect identified.  Liver: No focal mass lesion identified.  Within normal limits in parenchymal echogenicity.  IVC:  Appears normal.  Pancreas:  No abnormality identified.  Spleen:  Within normal limits in size and echotexture.  Right kidney:  Normal in size and parenchymal echogenicity.  No evidence of mass or hydronephrosis.  Left kidney:  Normal in size and parenchymal echogenicity.  No evidence of mass or hydronephrosis.  Abdominal Aorta:  No aneurysm identified.  IMPRESSION: Gallstones without other sonographic evidence for acute cholecystitis.   Original Report Authenticated By: Christiana Pellant, M.D.    MAU Course  Procedures None  MDM Discussed with Dr. Shawnie Pons. Get amylase, lipase and abdominal  US Discussed results with Dr.  Shawnie Pons. Call MD at ED for further instructions.  Discussed patient with ED MD [place]. Advised to consult general surgery for plan.  Discussed with PA from [facility]. Transfer patient to [facility] for consult and management Assessment and Plan  A: Gallstones  P: Transfer patient to [facility] for general surgery consult and management  Freddi Starr, PA-C  04/11/2013, 10:07 AM

## 2013-04-11 NOTE — ED Notes (Signed)
Will, PA paged

## 2013-04-11 NOTE — H&P (Signed)
General surgery attending note:  I have personally interviewed and examined this patient. I agree with the assessment and treatment plans outlined by Mr. Marlyne Beards, [place] and by Dr. Audley Hose from the GI department.  The patient is now asymptomatic and her abdominal exam is benign. We will plan to repeat her liver function tests tomorrow morning and decide whether to proceed with ERCP or go directly for cholecystectomy.  Allowe full liquids today.   N.p.o. After midnight.   Angelia Mould. Derrell Lolling, M.D., [facility] General and Minimally invasive Surgery Breast and Colorectal Surgery Office:   [phone] Pager:   [phone]

## 2013-04-11 NOTE — Consult Note (Signed)
Reason for Consult: Abnormal liver enzymes, Gallstones Referring Physician: CCS  [name] HPI: This is a 22 year old female who is postpartum by one month admitted for RUQ/epigastric pain and gallstones.  The patient's symptoms acutely started last evening and she thought that it was constipation.  The pain did wake her up from sleep.  Over time the pain subsided, but then it recurred after PO intake this AM.  Since the pain was severe she presented to the ER for further evaluation.  There is no prior history of this type of pain in the past.  Associated with her abdominal pain was nausea and vomiting.  Imaging revealed gallstones and sludge, but no evidence of any acute cholecystitis.  Her CBD was measured to be 6 mm.  AFter a vomiting episode in the ER her pain resolved rather quickly.  Past Medical History  Diagnosis Date  . Chronic kidney disease     caused by apples and hot dogs    Past Surgical History  Procedure Laterality Date  . No past surgeries      History reviewed. No pertinent family history.  Social History:  reports that she has never smoked. She has never used smokeless tobacco. She reports that she does not drink alcohol or use illicit drugs.  Allergies:  Allergies  Allergen Reactions  . Levaquin (Levofloxacin) Anaphylaxis  . Apple     "shut down kidneys"    Medications:  Scheduled: . ampicillin-sulbactam (UNASYN) IV  3 g Intravenous Q6H   Continuous: . dextrose 5 % and 0.45 % NaCl with KCl 40 mEq/L      Results for orders placed during the hospital encounter of 04/11/13 (from the past 24 hour(s))  URINALYSIS, ROUTINE W REFLEX MICROSCOPIC     Status: Abnormal   Collection Time    04/11/13  6:40 AM      Result Value Range   Color, Urine YELLOW  YELLOW   APPearance CLEAR  CLEAR   Specific Gravity, Urine 1.015  1.005 - 1.030   pH 6.0  5.0 - 8.0   Glucose, UA NEGATIVE  NEGATIVE mg/dL   Hgb urine dipstick NEGATIVE  NEGATIVE   Bilirubin Urine SMALL  (*) NEGATIVE   Ketones, ur NEGATIVE  NEGATIVE mg/dL   Protein, ur NEGATIVE  NEGATIVE mg/dL   Urobilinogen, UA 1.0  0.0 - 1.0 mg/dL   Nitrite NEGATIVE  NEGATIVE   Leukocytes, UA NEGATIVE  NEGATIVE  CBC     Status: Abnormal   Collection Time    04/11/13  6:56 AM      Result Value Range   WBC 4.8  4.0 - 10.5 K/uL   RBC 4.30  3.87 - 5.11 MIL/uL   Hemoglobin 10.8 (*) 12.0 - 15.0 g/dL   HCT 16.1 (*) 09.6 - 04.5 %   MCV 77.2 (*) 78.0 - 100.0 fL   MCH 25.1 (*) 26.0 - 34.0 pg   MCHC 32.5  30.0 - 36.0 g/dL   RDW 40.9  81.1 - 91.4 %   Platelets 269  150 - 400 K/uL  COMPREHENSIVE METABOLIC PANEL     Status: Abnormal   Collection Time    04/11/13  6:56 AM      Result Value Range   Sodium 140  135 - 145 mEq/L   Potassium 3.4 (*) 3.5 - 5.1 mEq/L   Chloride 103  96 - 112 mEq/L   CO2 27  19 - 32 mEq/L   Glucose, Bld 106 (*) 70 - 99  mg/dL   BUN 6  6 - 23 mg/dL   Creatinine, Ser 1.61  0.50 - 1.10 mg/dL   Calcium 9.5  8.4 - 09.6 mg/dL   Total Protein 7.1  6.0 - 8.3 g/dL   Albumin 3.9  3.5 - 5.2 g/dL   AST 045 (*) 0 - 37 U/L   ALT 453 (*) 0 - 35 U/L   Alkaline Phosphatase 200 (*) 39 - 117 U/L   Total Bilirubin 3.4 (*) 0.3 - 1.2 mg/dL   GFR calc non Af Amer >90  >90 mL/min   GFR calc Af Amer >90  >90 mL/min  AMYLASE     Status: None   Collection Time    04/11/13  6:56 AM      Result Value Range   Amylase 36  0 - 105 U/L  LIPASE, BLOOD     Status: None   Collection Time    04/11/13  6:56 AM      Result Value Range   Lipase 35  11 - 59 U/L     US Abdomen Complete  04/11/2013   *RADIOLOGY REPORT*  Clinical Data:  Nausea, vomiting, epigastric pain, elevated liver function tests  ABDOMINAL ULTRASOUND COMPLETE  Comparison:  None.  Findings:  Gallbladder:  Dependent gallstones noted without gallbladder wall thickening, pericholecystic fluid, or sonographic Murphy's sign.  Common Bile Duct:  Upper limits of normal, 6 mm.  Without intrahepatic ductal filling defect identified.  Liver: No focal  mass lesion identified.  Within normal limits in parenchymal echogenicity.  IVC:  Appears normal.  Pancreas:  No abnormality identified.  Spleen:  Within normal limits in size and echotexture.  Right kidney:  Normal in size and parenchymal echogenicity.  No evidence of mass or hydronephrosis.  Left kidney:  Normal in size and parenchymal echogenicity.  No evidence of mass or hydronephrosis.  Abdominal Aorta:  No aneurysm identified.  IMPRESSION: Gallstones without other sonographic evidence for acute cholecystitis.   Original Report Authenticated By: Christiana Pellant, M.D.    ROS:  As stated above in the HPI otherwise negative.  Blood pressure 112/75, pulse 76, temperature 98.2 F (36.8 C), temperature source Oral, resp. rate 16, height 5\' 6"  (1.676 m), weight 127 lb 4.8 oz (57.743 kg), SpO2 98.00%, currently breastfeeding.    PE: Gen: NAD, Alert and Oriented HEENT:  [place]/AT, EOMI Neck: Supple, no LAD Lungs: CTA Bilaterally CV: RRR without M/G/R ABM: Soft, NTND, +BS Ext: No C/C/E  Assessment/Plan: 1) Cholelithiasis. 2) Abnormal liver enzymes. 3) ? Choledocholithiasis.   Her CBD is only 6 mm and her pain has resolved.  She may have passed a gallstone.  I will tentatively place her on the schedule for an EUS+/-ERCP.  If he transaminases and bilirubin continue to drop I will recommend a cholecystectomy and then an IOC.    Plan: 1) Follow liver enzymes. 2) EUS+/-ERCP pending the results of the liver enzymes.  Jordyne Poehlman D 04/11/2013, 1:11 PM

## 2013-04-12 ENCOUNTER — Observation Stay (HOSPITAL_COMMUNITY): Payer: Medicaid Other | Admitting: Certified Registered Nurse Anesthetist

## 2013-04-12 ENCOUNTER — Observation Stay (HOSPITAL_COMMUNITY): Payer: Medicaid Other

## 2013-04-12 ENCOUNTER — Encounter (HOSPITAL_COMMUNITY): Payer: Self-pay | Admitting: Certified Registered Nurse Anesthetist

## 2013-04-12 ENCOUNTER — Encounter (HOSPITAL_COMMUNITY): Admission: AD | Disposition: A | Discharge status: Home / Self Care | Payer: Self-pay | Source: Ambulatory Visit

## 2013-04-12 DIAGNOSIS — K8010 Calculus of gallbladder with chronic cholecystitis without obstruction: Secondary | ICD-10-CM

## 2013-04-12 HISTORY — PX: CHOLECYSTECTOMY: SHX55

## 2013-04-12 LAB — COMPREHENSIVE METABOLIC PANEL WITH GFR
ALT: 355 U/L — ABNORMAL HIGH (ref 0–35)
AST: 233 U/L — ABNORMAL HIGH (ref 0–37)
Albumin: 3.4 g/dL — ABNORMAL LOW (ref 3.5–5.2)
Alkaline Phosphatase: 185 U/L — ABNORMAL HIGH (ref 39–117)
BUN: 6 mg/dL (ref 6–23)
CO2: 25 meq/L (ref 19–32)
Calcium: 9 mg/dL (ref 8.4–10.5)
Chloride: 107 meq/L (ref 96–112)
Creatinine, Ser: 0.7 mg/dL (ref 0.50–1.10)
GFR calc Af Amer: >90 mL/min
GFR calc non Af Amer: >90 mL/min
Glucose, Bld: 83 mg/dL (ref 70–99)
Potassium: 3.9 meq/L (ref 3.5–5.1)
Sodium: 141 meq/L (ref 135–145)
Total Bilirubin: 0.8 mg/dL (ref 0.3–1.2)
Total Protein: 6.4 g/dL (ref 6.0–8.3)

## 2013-04-12 LAB — CBC
HCT: 32.3 % — ABNORMAL LOW (ref 36.0–46.0)
Hemoglobin: 10.4 g/dL — ABNORMAL LOW (ref 12.0–15.0)
MCH: 25.4 pg — ABNORMAL LOW (ref 26.0–34.0)
MCHC: 32.2 g/dL (ref 30.0–36.0)
MCV: 78.8 fL (ref 78.0–100.0)
Platelets: 242 K/uL (ref 150–400)
RBC: 4.1 MIL/uL (ref 3.87–5.11)
RDW: 14.6 % (ref 11.5–15.5)
WBC: 4.8 K/uL (ref 4.0–10.5)

## 2013-04-12 SURGERY — LAPAROSCOPIC CHOLECYSTECTOMY WITH INTRAOPERATIVE CHOLANGIOGRAM
Anesthesia: General | Site: Abdomen | Wound class: Clean Contaminated

## 2013-04-12 SURGERY — UPPER ENDOSCOPIC ULTRASOUND (EUS) LINEAR
Anesthesia: Moderate Sedation

## 2013-04-12 MED ORDER — BUPIVACAINE-EPINEPHRINE 0.5% -1:200000 IJ SOLN
INTRAMUSCULAR | Status: DC | PRN
  Administered 2013-04-12: 9 mL

## 2013-04-12 MED ORDER — FENTANYL CITRATE 0.05 MG/ML IJ SOLN
INTRAMUSCULAR | Status: DC | PRN
  Administered 2013-04-12 (×5): 50 ug via INTRAVENOUS

## 2013-04-12 MED ORDER — MEPERIDINE HCL 50 MG/ML IJ SOLN
6.2500 mg | INTRAMUSCULAR | Status: DC | PRN
  Administered 2013-04-12: 6.25 mg via INTRAVENOUS

## 2013-04-12 MED ORDER — HYDROMORPHONE HCL PF 1 MG/ML IJ SOLN: 0.5000 mg | INTRAMUSCULAR | Status: DC | PRN

## 2013-04-12 MED ORDER — NEOSTIGMINE METHYLSULFATE 1 MG/ML IJ SOLN
INTRAMUSCULAR | Status: DC | PRN
  Administered 2013-04-12: 5 mg via INTRAVENOUS

## 2013-04-12 MED ORDER — ROCURONIUM BROMIDE 100 MG/10ML IV SOLN
INTRAVENOUS | Status: DC | PRN
  Administered 2013-04-12: 20 mg via INTRAVENOUS

## 2013-04-12 MED ORDER — ONDANSETRON HCL 4 MG/2ML IJ SOLN
INTRAMUSCULAR | Status: DC | PRN
  Administered 2013-04-12: 4 mg via INTRAVENOUS

## 2013-04-12 MED ORDER — PRENATAL MULTIVITAMIN CH
1.0000 | ORAL_TABLET | Freq: Every day | ORAL | Status: DC
  Filled 2013-04-12 (×3): qty 1

## 2013-04-12 MED ORDER — LACTATED RINGERS IV SOLN
INTRAVENOUS | Status: DC | PRN
  Administered 2013-04-12: 11:00:00 via INTRAVENOUS

## 2013-04-12 MED ORDER — PROPOFOL 10 MG/ML IV BOLUS
INTRAVENOUS | Status: DC | PRN
  Administered 2013-04-12: 150 mg via INTRAVENOUS

## 2013-04-12 MED ORDER — FENTANYL CITRATE 0.05 MG/ML IJ SOLN: 25.0000 ug | INTRAMUSCULAR | Status: DC | PRN

## 2013-04-12 MED ORDER — DEXAMETHASONE SODIUM PHOSPHATE 10 MG/ML IJ SOLN
INTRAMUSCULAR | Status: DC | PRN
  Administered 2013-04-12: 10 mg via INTRAVENOUS

## 2013-04-12 MED ORDER — OXYCODONE-ACETAMINOPHEN 5-325 MG PO TABS
1.0000 | ORAL_TABLET | ORAL | Status: DC | PRN
  Administered 2013-04-13: 2 via ORAL
  Administered 2013-04-14: 1 via ORAL
  Filled 2013-04-12: qty 2
  Filled 2013-04-12: qty 1

## 2013-04-12 MED ORDER — POTASSIUM CHLORIDE IN NACL 20-0.9 MEQ/L-% IV SOLN
INTRAVENOUS | Status: DC
  Administered 2013-04-12 – 2013-04-13 (×2): via INTRAVENOUS
  Filled 2013-04-12 (×6): qty 1000

## 2013-04-12 MED ORDER — CEFAZOLIN SODIUM-DEXTROSE 2-3 GM-% IV SOLR: INTRAVENOUS | Status: DC | PRN

## 2013-04-12 MED ORDER — KETAMINE HCL 10 MG/ML IJ SOLN
INTRAMUSCULAR | Status: DC | PRN
  Administered 2013-04-12: 20 mg via INTRAVENOUS

## 2013-04-12 MED ORDER — SODIUM CHLORIDE 0.9 % IV SOLN
3.0000 g | Freq: Four times a day (QID) | INTRAVENOUS | Status: AC
  Administered 2013-04-12 – 2013-04-13 (×3): 3 g via INTRAVENOUS
  Filled 2013-04-12 (×3): qty 3

## 2013-04-12 MED ORDER — CEFAZOLIN SODIUM-DEXTROSE 2-3 GM-% IV SOLR
2.0000 g | Freq: Once | INTRAVENOUS | Status: AC
  Administered 2013-04-12: 2 g via INTRAVENOUS

## 2013-04-12 MED ORDER — SUCCINYLCHOLINE CHLORIDE 20 MG/ML IJ SOLN
INTRAMUSCULAR | Status: DC | PRN
  Administered 2013-04-12: 100 mg via INTRAVENOUS

## 2013-04-12 MED ORDER — GLYCOPYRROLATE 0.2 MG/ML IJ SOLN
INTRAMUSCULAR | Status: DC | PRN
  Administered 2013-04-12: 0.6 mg via INTRAVENOUS

## 2013-04-12 MED ORDER — MIDAZOLAM HCL 5 MG/5ML IJ SOLN
INTRAMUSCULAR | Status: DC | PRN
  Administered 2013-04-12: 2 mg via INTRAVENOUS

## 2013-04-12 MED ORDER — IOHEXOL 300 MG/ML  SOLN
INTRAMUSCULAR | Status: DC | PRN
  Administered 2013-04-12: 35 mL via INTRAVENOUS

## 2013-04-12 MED ORDER — LACTATED RINGERS IV SOLN: INTRAVENOUS | Status: DC

## 2013-04-12 MED ORDER — DOCUSATE SODIUM 100 MG PO CAPS
100.0000 mg | ORAL_CAPSULE | Freq: Two times a day (BID) | ORAL | Status: DC
  Administered 2013-04-13 – 2013-04-14 (×2): 100 mg via ORAL
  Filled 2013-04-12 (×5): qty 1

## 2013-04-12 MED ORDER — PHENYLEPHRINE HCL 10 MG/ML IJ SOLN
INTRAMUSCULAR | Status: DC | PRN
  Administered 2013-04-12: 40 ug via INTRAVENOUS

## 2013-04-12 MED ORDER — LIDOCAINE HCL (CARDIAC) 20 MG/ML IV SOLN
INTRAVENOUS | Status: DC | PRN
  Administered 2013-04-12: 100 mg via INTRAVENOUS

## 2013-04-12 MED ORDER — METOCLOPRAMIDE HCL 5 MG/ML IJ SOLN
INTRAMUSCULAR | Status: DC | PRN
  Administered 2013-04-12: 10 mg via INTRAVENOUS

## 2013-04-12 SURGICAL SUPPLY — 36 items
APPLIER CLIP ROT 10 11.4 M/L (STAPLE) ×2
BENZOIN TINCTURE PRP APPL 2/3 (GAUZE/BANDAGES/DRESSINGS) IMPLANT
CANISTER SUCTION 2500CC (MISCELLANEOUS) ×2 IMPLANT
CLIP APPLIE ROT 10 11.4 M/L (STAPLE) ×1 IMPLANT
CLOTH BEACON ORANGE TIMEOUT ST (SAFETY) ×2 IMPLANT
COVER MAYO STAND STRL (DRAPES) ×2 IMPLANT
DECANTER SPIKE VIAL GLASS SM (MISCELLANEOUS) ×2 IMPLANT
DERMABOND ADVANCED (GAUZE/BANDAGES/DRESSINGS) ×1
DERMABOND ADVANCED .7 DNX12 (GAUZE/BANDAGES/DRESSINGS) ×1 IMPLANT
DRAPE C-ARM 42X120 X-RAY (DRAPES) ×2 IMPLANT
DRAPE LAPAROSCOPIC ABDOMINAL (DRAPES) ×2 IMPLANT
ELECT REM PT RETURN 9FT ADLT (ELECTROSURGICAL) ×2
ELECTRODE REM PT RTRN 9FT ADLT (ELECTROSURGICAL) ×1 IMPLANT
GLOVE BIOGEL PI IND STRL 7.0 (GLOVE) ×1 IMPLANT
GLOVE BIOGEL PI INDICATOR 7.0 (GLOVE) ×1
GLOVE EUDERMIC 7 POWDERFREE (GLOVE) ×2 IMPLANT
GOWN STRL NON-REIN LRG LVL3 (GOWN DISPOSABLE) ×2 IMPLANT
GOWN STRL REIN XL XLG (GOWN DISPOSABLE) ×4 IMPLANT
HEMOSTAT SNOW SURGICEL 2X4 (HEMOSTASIS) IMPLANT
IV LACTATED RINGER IRRG 3000ML (IV SOLUTION) ×1
IV LR IRRIG 3000ML ARTHROMATIC (IV SOLUTION) ×1 IMPLANT
KIT BASIN OR (CUSTOM PROCEDURE TRAY) ×2 IMPLANT
NS IRRIG 1000ML POUR BTL (IV SOLUTION) ×2 IMPLANT
POUCH SPECIMEN RETRIEVAL 10MM (ENDOMECHANICALS) ×2 IMPLANT
SET CHOLANGIOGRAPH MIX (MISCELLANEOUS) ×2 IMPLANT
SET IRRIG TUBING LAPAROSCOPIC (IRRIGATION / IRRIGATOR) ×2 IMPLANT
SOLUTION ANTI FOG 6CC (MISCELLANEOUS) ×2 IMPLANT
STRIP CLOSURE SKIN 1/2X4 (GAUZE/BANDAGES/DRESSINGS) ×2 IMPLANT
SUT MNCRL AB 4-0 PS2 18 (SUTURE) ×2 IMPLANT
SYRINGE IRR TOOMEY STRL 70CC (SYRINGE) IMPLANT
TOWEL OR 17X26 10 PK STRL BLUE (TOWEL DISPOSABLE) ×4 IMPLANT
TRAY LAP CHOLE (CUSTOM PROCEDURE TRAY) ×2 IMPLANT
TROCAR BLADELESS OPT 5 75 (ENDOMECHANICALS) ×2 IMPLANT
TROCAR XCEL BLUNT TIP 100MML (ENDOMECHANICALS) ×2 IMPLANT
TROCAR XCEL NON-BLD 11X100MML (ENDOMECHANICALS) ×2 IMPLANT
TUBING INSUFFLATION 10FT LAP (TUBING) ×2 IMPLANT

## 2013-04-12 NOTE — Progress Notes (Signed)
Demerol 6.25 mg IVP given for shaking and shivering 

## 2013-04-12 NOTE — Preoperative (Signed)
Beta Blockers   Reason not to administer Beta Blockers:Not Applicable 

## 2013-04-12 NOTE — Transfer of Care (Signed)
Immediate Anesthesia Transfer of Care Note  Patient: Margaret Gregory  Procedure(s) Performed: Procedure(s) (LRB): LAPAROSCOPIC CHOLECYSTECTOMY WITH INTRAOPERATIVE CHOLANGIOGRAM (N/A)  Patient Location: PACU  Anesthesia Type: General  Level of Consciousness: sedated, patient cooperative and responds to stimulaton  Airway & Oxygen Therapy: Patient Spontanous Breathing and Patient connected to face mask oxgen  Post-op Assessment: Report given to PACU RN and Post -op Vital signs reviewed and stable  Post vital signs: Reviewed and stable  Complications: No apparent anesthesia complications

## 2013-04-12 NOTE — Anesthesia Postprocedure Evaluation (Signed)
  Anesthesia Post-op Note  Patient: Margaret Gregory  Procedure(s) Performed: Procedure(s) (LRB): LAPAROSCOPIC CHOLECYSTECTOMY WITH INTRAOPERATIVE CHOLANGIOGRAM (N/A)  Patient Location: PACU  Anesthesia Type: General  Level of Consciousness: awake and alert   Airway and Oxygen Therapy: Patient Spontanous Breathing  Post-op Pain: mild  Post-op Assessment: Post-op Vital signs reviewed, Patient's Cardiovascular Status Stable, Respiratory Function Stable, Patent Airway and No signs of Nausea or vomiting  Last Vitals:  Filed Vitals:   04/12/13 1820  BP: 104/65  Pulse: 60  Temp: 36.7 C  Resp: 16    Post-op Vital Signs: stable   Complications: No apparent anesthesia complications

## 2013-04-12 NOTE — Progress Notes (Signed)
General surgery attending note:  Patient is asymptomatic and afebrile. Abdomen is soft and nontender. Liver function tests are improved and total bilirubin is normal.  Because of the improvement in her liver function tests, it is more likely that she has passed her common bile duct stone. We will therefore proceed with laparoscopic cholecystectomy with cholangiogram today. If she has retained stones we will asked Dr. Elnoria Howard to perform ERCP postop.  This has been discussed with the patient. She is in agreement with this plan.I discussed the indications, details, techniques, and numerous risks of gallbladder surgery with her. She is aware of the risk of bleeding, infection, conversion to open laparotomy, injury to adjacent organs such as the main bile duct test and, bile leak, and other unforseen problems. She understands all these issues and all her questions are answered. She agrees with this plan.   Angelia Mould. Derrell Lolling, M.D., [facility] General and Minimally invasive Surgery Breast and Colorectal Surgery Office:   [phone] Pager:   [phone]

## 2013-04-12 NOTE — Anesthesia Preprocedure Evaluation (Signed)

## 2013-04-12 NOTE — Progress Notes (Signed)
Shaking and shivering stopped. 

## 2013-04-12 NOTE — Op Note (Signed)
Patient Name:           Margaret Gregory   Date of Surgery:        04/12/2013  Pre op Diagnosis:      Chronic cholecystitis with cholelithiasis, abnormal liver function tests And probable choledocholithiasis  Post op Diagnosis:    same  Procedure:                 Laparoscopic cholecystectomy with cholangiogram  Surgeon:                     Angelia Mould. Derrell Lolling, M.D., FACS  Assistant:                      Sherrie George, [place]  Operative Indications:   This is a 22 year old female who is 5 weeks postpartum. She was admitted yesterday with right upper quadrant pain, epigastric pain, nausea and vomiting.Liver function test showed total bilirubin 3.4 with elevation of AST and ALT and normal lipase.Ultrasound shows gallstones, No inflammatory change, CBD 6 mm, no filling defect seen. Treatment plan was discussed with Dr. Jeani Hawking of GI. We elected to repeat her liver function test 24 hours later. This morning her Total bilirubin was 0.8 and her enzymes were improved. We theorized that she may have passed a CBD stone. We elected to proceed with cholecystectomy with cholangiogram  Operative Findings:       The gallbladder was chronically inflamed, but was not acutely inflamed. The anatomy of the cystic duct and common bile duct and cystic artery were conventional. The intraoperative cholangiogram showed normal intrahepatic and extrahepatic biliary anatomy, no definite filling defects, but the contrast did not go into the duodenum at all despite 2 separate cholangiogram attempts. The liver, stomach, duodenum, small intestine, and large intestine were grossly normal to inspection.  Procedure in Detail:          Following the induction of general endotracheal anesthesia the patient's abdomen was prepped and draped in a sterile fashion. Intravenous antibiotics were given. Surgical time out was performed. 0.5% Marcaine with epinephrine was used as a local infiltration anesthetic. A short vertical incision was  made at the lower rim of the umbilicus. The fascia was incised in the midline and abdominal cavity entered under direct vision. An 11 mm Hassan trocar was inserted and secured with a pursestring suture of 0 Vicryl. Pneumoperitoneum was created and a video camera inserted. An 11 mm trocar was placed in a subsartorial region and two 5 mm trocars placed in the right upper quadrant.  The gallbladder fundus was elevated. Adhesions were taken down off of the body and infundibulum. The infundibulum was retracted laterally. I dissected out the anterior branch of the cystic artery as it went onto the wall the gallbladder, isolated it and secured it with multiple metal clips and divided it. I then dissected the peritoneum off the neck of the gallbladder and isolated the cystic duct. A cholangiogram was obtained with the C-arm after inserting a cholangiogram catheter into the cystic duct. The cholangiogram showed normal anatomy, but did not empty into the duodenum. I did not see a clear-cut filling defect. The cholangiocatheter was removed, the cystic duct was secured with multiple metal clips and divided. I isolated the posterior branch of the cystic artery, secured it with multiple metal clips and divided it. The gallbladder was dissected from its bed with electrocautery, placed in a specimen bag and removed. The operative field was irrigated. Hemostasis was excellent and  achieved with electrocautery. At the end of the case the irrigation fluid was completely clear and there was no bleeding or bile leak. The pneumoperitoneum was released and the trocars were removed. The fascia at the umbilicus was closed with 0 Vicryl sutures and the skin incisions were closed with subcuticular stitches of 4-0 Monocryl and Dermabond. The patient tolerated the procedure well taken to recovery room stable. EBL 10 cc. Counts correct. Complications none.     Angelia Mould. Derrell Lolling, M.D., FACS General and Minimally Invasive Surgery Breast  and Colorectal Surgery  04/12/2013 12:47 PM

## 2013-04-12 NOTE — Progress Notes (Signed)
Subjective: Feeling well, but she did have some similar abdominal pain when she was being wheeled into her room.  Objective: Vital signs in last 24 hours: Temp:  [97.6 F (36.4 C)-98.8 F (37.1 C)] 98.4 F (36.9 C) (07/29 1420) Pulse Rate:  [58-117] 77 (07/29 1420) Resp:  [14-24] 14 (07/29 1420) BP: (98-125)/(58-71) 113/71 mmHg (07/29 1420) SpO2:  [94 %-100 %] 100 % (07/29 1420) Last BM Date: 04/10/13  Intake/Output from previous day: 07/28 0701 - 07/29 0700 In: 1880 [P.O.:120; I.V.:1460; IV Piggyback:300] Out: 1 [Urine:1] Intake/Output this shift: Total I/O In: 300 [I.V.:300] Out: 0   General appearance: alert and no distress GI: tender at the incision sites  Lab Results:  Recent Labs  04/11/13 0656 04/12/13 0500  WBC 4.8 4.8  HGB 10.8* 10.4*  HCT 33.2* 32.3*  PLT 269 242   BMET  Recent Labs  04/11/13 0656 04/12/13 0500  NA 140 141  K 3.4* 3.9  CL 103 107  CO2 27 25  GLUCOSE 106* 83  BUN 6 6  CREATININE 0.72 0.70  CALCIUM 9.5 9.0   LFT  Recent Labs  04/12/13 0500  PROT 6.4  ALBUMIN 3.4*  AST 233*  ALT 355*  ALKPHOS 185*  BILITOT 0.8   PT/INR No results found for this basename: LABPROT, INR,  in the last 72 hours Hepatitis Panel No results found for this basename: HEPBSAG, HCVAB, HEPAIGM, HEPBIGM,  in the last 72 hours C-Diff No results found for this basename: CDIFFTOX,  in the last 72 hours Fecal Lactopherrin No results found for this basename: FECLLACTOFRN,  in the last 72 hours  Studies/Results: Dg Cholangiogram Operative  04/12/2013   *RADIOLOGY REPORT*  Clinical data:  Cholelithiasis.  Laparoscopic cholecystectomy.  INTRAOPERATIVE CHOLANGIOGRAM  Comparison: Abdominal ultrasound yesterday.  Findings:  2 series of cholangiographic images from the C-arm fluoroscopic device were submitted for interpretation post- operatively.  The cannula is present in the cystic duct remnant with excellent opacification of the common bile duct, common  hepatic duct, and proximal intrahepatic ducts.  No filling defects are identified within the bile duct.  However, on neither series did contrast enter the duodenum.  There is no extravasation at the injection site.  There is no intra or extrahepatic biliary ductal dilation.  The radiologic technologist documented 36 seconds of fluoroscopy time.  Please correlate with findings at real time fluoroscopy.  IMPRESSION: No visible common bile duct stones, though contrast did not enter the duodenum.  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  No biliary ductal dilation.  Should the liver function tests remain elevated post-operatively, follow-up imaging with MRCP or ERCP may be helpful.  These images were submitted for radiologic interpretation only. Please see the procedural report for the amount of contrast and the fluoroscopy time utilized.   Original Report Authenticated By: Hulan Saas, M.D.   US Abdomen Complete  04/11/2013   *RADIOLOGY REPORT*  Clinical Data:  Nausea, vomiting, epigastric pain, elevated liver function tests  ABDOMINAL ULTRASOUND COMPLETE  Comparison:  None.  Findings:  Gallbladder:  Dependent gallstones noted without gallbladder wall thickening, pericholecystic fluid, or sonographic Murphy's sign.  Common Bile Duct:  Upper limits of normal, 6 mm.  Without intrahepatic ductal filling defect identified.  Liver: No focal mass lesion identified.  Within normal limits in parenchymal echogenicity.  IVC:  Appears normal.  Pancreas:  No abnormality identified.  Spleen:  Within normal limits in size and echotexture.  Right kidney:  Normal in size and parenchymal echogenicity.  No evidence of mass or hydronephrosis.  Left kidney:  Normal in size and parenchymal echogenicity.  No evidence of mass or hydronephrosis.  Abdominal Aorta:  No aneurysm identified.  IMPRESSION: Gallstones without other sonographic evidence for acute cholecystitis.   Original Report Authenticated By: Christiana Pellant, M.D.     Medications:  Scheduled: . ampicillin-sulbactam (UNASYN) IV  3 g Intravenous Q6H  . docusate sodium  100 mg Oral BID  . prenatal multivitamin  1 tablet Oral Q1200   Continuous: . 0.9 % NaCl with KCl 20 mEq / L 100 mL/hr at 04/12/13 1519    Assessment/Plan: 1) Cholelithiasis. 2) ? Ampullary spasm. 3) Abnormal liver enzymes.   I discussed the case with Dr. Derrell Lolling.  I think she passed a stone and now she has ampullary spasm, however, I think it will be prudent to evaluate her with an EUS.  Unfortunately, I cannot combine an EUS with an ERCP, if needed, as a result of scheduling.  I do anticipate her liver enzymes to elevate as a result of the cholangiogram.  Plan: 1) EUS in the AM.   LOS: 1 day   Jhaniya Briski D 04/12/2013, 4:59 PM

## 2013-04-13 ENCOUNTER — Encounter (HOSPITAL_COMMUNITY): Payer: Self-pay

## 2013-04-13 ENCOUNTER — Encounter (HOSPITAL_COMMUNITY): Admission: AD | Disposition: A | Discharge status: Home / Self Care | Payer: Self-pay | Source: Ambulatory Visit

## 2013-04-13 HISTORY — PX: EUS: SHX5427

## 2013-04-13 LAB — CBC
MCHC: 31.9 g/dL (ref 30.0–36.0)
MCV: 79.2 fL (ref 78.0–100.0)
Platelets: 262 10*3/uL (ref 150–400)
RDW: 14.6 % (ref 11.5–15.5)
WBC: 7.9 10*3/uL (ref 4.0–10.5)

## 2013-04-13 LAB — COMPREHENSIVE METABOLIC PANEL
Albumin: 3.3 g/dL — ABNORMAL LOW (ref 3.5–5.2)
Alkaline Phosphatase: 236 U/L — ABNORMAL HIGH (ref 39–117)
BUN: 5 mg/dL — ABNORMAL LOW (ref 6–23)
Chloride: 100 mEq/L (ref 96–112)
Glucose, Bld: 74 mg/dL (ref 70–99)
Potassium: 4.1 mEq/L (ref 3.5–5.1)
Total Bilirubin: 1.7 mg/dL — ABNORMAL HIGH (ref 0.3–1.2)

## 2013-04-13 SURGERY — UPPER ENDOSCOPIC ULTRASOUND (EUS) LINEAR
Anesthesia: Moderate Sedation

## 2013-04-13 SURGERY — ESOPHAGEAL ENDOSCOPIC ULTRASOUND (EUS) RADIAL

## 2013-04-13 MED ORDER — PROMETHAZINE HCL 25 MG/ML IJ SOLN
12.5000 mg | INTRAMUSCULAR | Status: DC | PRN
  Administered 2013-04-13: 12.5 mg via INTRAVENOUS
  Filled 2013-04-13: qty 1

## 2013-04-13 MED ORDER — FENTANYL CITRATE 0.05 MG/ML IJ SOLN
INTRAMUSCULAR | Status: DC | PRN
  Administered 2013-04-13 (×3): 25 ug via INTRAVENOUS

## 2013-04-13 MED ORDER — CEFAZOLIN SODIUM-DEXTROSE 2-3 GM-% IV SOLR
2.0000 g | INTRAVENOUS | Status: AC
  Filled 2013-04-13: qty 50

## 2013-04-13 MED ORDER — MIDAZOLAM HCL 10 MG/2ML IJ SOLN
INTRAMUSCULAR | Status: AC
  Filled 2013-04-13: qty 4

## 2013-04-13 MED ORDER — FENTANYL CITRATE 0.05 MG/ML IJ SOLN
INTRAMUSCULAR | Status: AC
  Filled 2013-04-13: qty 4

## 2013-04-13 MED ORDER — BUTAMBEN-TETRACAINE-BENZOCAINE 2-2-14 % EX AERO
INHALATION_SPRAY | CUTANEOUS | Status: DC | PRN
  Administered 2013-04-13: 2 via TOPICAL

## 2013-04-13 MED ORDER — SODIUM CHLORIDE 0.9 % IV SOLN: INTRAVENOUS | Status: DC

## 2013-04-13 MED ORDER — CHLORHEXIDINE GLUCONATE 4 % EX LIQD
1.0000 "application " | Freq: Once | CUTANEOUS | Status: DC
  Filled 2013-04-13: qty 15

## 2013-04-13 MED ORDER — DIPHENHYDRAMINE HCL 50 MG/ML IJ SOLN
INTRAMUSCULAR | Status: AC
  Filled 2013-04-13: qty 1

## 2013-04-13 MED ORDER — MIDAZOLAM HCL 10 MG/2ML IJ SOLN
INTRAMUSCULAR | Status: DC | PRN
  Administered 2013-04-13: 1 mg via INTRAVENOUS
  Administered 2013-04-13: 2 mg via INTRAVENOUS
  Administered 2013-04-13 (×3): 1 mg via INTRAVENOUS

## 2013-04-13 NOTE — Op Note (Signed)
[facility] [address] [place], [address]   ENDOSCOPIC ULTRASOUND PROCEDURE REPORT  PATIENT: Margaret Gregory, Margaret Gregory  MR#: [id number] BIRTHDATE: 06-May-1991  GENDER: Female ENDOSCOPIST: Jeani Hawking, MD REFERRED BY: PROCEDURE DATE:  04/13/2013 PROCEDURE:   EUS ASA CLASS:      Class II INDICATIONS:   ? Choledocholithiasis MEDICATIONS: Versed 5 mg IV and Fentanyl 75 mcg IV  DESCRIPTION OF PROCEDURE:   After the risks benefits and alternatives of the procedure were  explained, informed consent was obtained. The patient was then placed in the left, lateral, decubitus postion and IV sedation was administered. Throughout the procedure, the patients blood pressure, pulse and oxygen saturations were monitored continuously.  Under direct visualization, the     endoscope was introduced through the mouth and advanced to the second portion of the duodenum .  Water was used as necessary to provide an acoustic interface.  Upon completion of the imaging, water was removed and the patient was sent to the recovery room in satisfactory condition.      FINDINGS: The CBD was identified and it was measured at 6 mm.  There was no evidence of any stones or sludge in the CBD.  The cystic duct was identified and a surgical clip was noted.  No other abnormalities identified.   The scope was then withdrawn from the patient and the procedure completed.  COMPLICATIONS: There were no complications. ENDOSCOPIC VISUALIZATION:  ULTRASONIC VISUALIZATION:  STAGING:  ENDOSCOPIC IMPRESSION: 1) No evidence of choledocholithiasis. 2) S/p cholecystectomy.  RECOMMENDATIONS: 1) Routine post-op surgical care.  _______________________________ eSignedJeani Hawking, MD 04/13/2013 8:16 AM   CC:

## 2013-04-13 NOTE — Progress Notes (Signed)
I agree with the assessment and treatment plan outlined by Doristine Mango, PA   Angelia Mould. Derrell Lolling, M.D., [facility] General and Minimally invasive Surgery Breast and Colorectal Surgery Office:   [phone] Pager:   [phone]

## 2013-04-13 NOTE — Progress Notes (Signed)
Patient ID: Margaret Gregory, female   DOB: 01-15-1991, 22 y.o.   MRN: [id number] Day of Surgery  Subjective: Drowsy from procedure this am but overall doing ok, would like to go home if able today, appropriate pain, denies n/v  Objective: Vital signs in last 24 hours: Temp:  [97.6 F (36.4 C)-98.8 F (37.1 C)] 97.6 F (36.4 C) (07/30 0904) Pulse Rate:  [53-117] 68 (07/30 0904) Resp:  [9-31] 12 (07/30 0840) BP: (86-127)/(44-87) 93/56 mmHg (07/30 0904) SpO2:  [94 %-100 %] 98 % (07/30 0904) Last BM Date: 04/10/13  Intake/Output from previous day: 07/29 0701 - 07/30 0700 In: 2068.3 [I.V.:1768.3; IV Piggyback:300] Out: 600 [Emesis/NG output:600] Intake/Output this shift:    PE: Abd: soft, mildly tender over incision but otherwise benign, +BS, incisions are c/d/i General: drowsy but arousable, NAD  Lab Results:   Recent Labs  04/12/13 0500 04/13/13 0520  WBC 4.8 7.9  HGB 10.4* 10.6*  HCT 32.3* 33.2*  PLT 242 262   BMET  Recent Labs  04/12/13 0500 04/13/13 0520  NA 141 137  K 3.9 4.1  CL 107 100  CO2 25 26  GLUCOSE 83 74  BUN 6 5*  CREATININE 0.70 0.71  CALCIUM 9.0 9.1   PT/INR No results found for this basename: LABPROT, INR,  in the last 72 hours CMP     Component Value Date/Time   NA 137 04/13/2013 0520   K 4.1 04/13/2013 0520   CL 100 04/13/2013 0520   CO2 26 04/13/2013 0520   GLUCOSE 74 04/13/2013 0520   BUN 5* 04/13/2013 0520   CREATININE 0.71 04/13/2013 0520   CALCIUM 9.1 04/13/2013 0520   PROT 6.3 04/13/2013 0520   ALBUMIN 3.3* 04/13/2013 0520   AST 275* 04/13/2013 0520   ALT 363* 04/13/2013 0520   ALKPHOS 236* 04/13/2013 0520   BILITOT 1.7* 04/13/2013 0520   GFRNONAA >90 04/13/2013 0520   GFRAA >90 04/13/2013 0520   Lipase     Component Value Date/Time   LIPASE 22 04/12/2013 0500       Studies/Results: Dg Cholangiogram Operative  04/12/2013   *RADIOLOGY REPORT*  Clinical data:  Cholelithiasis.  Laparoscopic cholecystectomy.  INTRAOPERATIVE  CHOLANGIOGRAM  Comparison: Abdominal ultrasound yesterday.  Findings:  2 series of cholangiographic images from the C-arm fluoroscopic device were submitted for interpretation post- operatively.  The cannula is present in the cystic duct remnant with excellent opacification of the common bile duct, common hepatic duct, and proximal intrahepatic ducts.  No filling defects are identified within the bile duct.  However, on neither series did contrast enter the duodenum.  There is no extravasation at the injection site.  There is no intra or extrahepatic biliary ductal dilation.  The radiologic technologist documented 36 seconds of fluoroscopy time.  Please correlate with findings at real time fluoroscopy.  IMPRESSION: No visible common bile duct stones, though contrast did not enter the duodenum.  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  No biliary ductal dilation.  Should the liver function tests remain elevated post-operatively, follow-up imaging with MRCP or ERCP may be helpful.  These images were submitted for radiologic interpretation only. Please see the procedural report for the amount of contrast and the fluoroscopy time utilized.   Original Report Authenticated By: Hulan Saas, M.D.    Anti-infectives: Anti-infectives   Start     Dose/Rate Route Frequency Ordered Stop   04/13/13 0910  ceFAZolin (ANCEF) IVPB 2 g/50 mL premix     2 g 100 mL/hr over  30 Minutes Intravenous On call to O.R. 04/13/13 0910 04/14/13 0559   04/12/13 1500  Ampicillin-Sulbactam (UNASYN) 3 g in sodium chloride 0.9 % 100 mL IVPB     3 g 100 mL/hr over 60 Minutes Intravenous Every 6 hours 04/12/13 1428 04/13/13 0337   04/12/13 1130  ceFAZolin (ANCEF) IVPB 2 g/50 mL premix     2 g 100 mL/hr over 30 Minutes Intravenous  Once 04/12/13 1231 04/12/13 1130   04/11/13 1200  Ampicillin-Sulbactam (UNASYN) 3 g in sodium chloride 0.9 % 100 mL IVPB  Status:  Discontinued     3 g 100 mL/hr over 60 Minutes Intravenous Every 6 hours  04/11/13 1156 04/12/13 1610       Assessment/Plan POD#1-lap cholecystectomy: EUS this am, awaiting results, if negative then could potentially go home later today, patient agreeable to this, otherwise would need ERCP which apparently would not be possible until tomorrow.  Will arrange discharge packet for patient and if able she can go home later today.   LOS: 2 days    Hutson Luft 04/13/2013

## 2013-04-13 NOTE — Progress Notes (Signed)
I have interviewed and examined this patient this afternoon. I agree with the assessment and treatment plan by Sherrie George, PA. She is doing well following her laparoscopic cholecystectomy without any obvious surgical complications.  EUS showed a 6 mm CBD and no stones. Hopefully she just has some edema in the periampullary region and this will resolve. Her abdomen is soft and her wounds looked fine. We'll check liver function tests tomorrow morning and hopefully let her go home tomorrow morning. She will followup with [place] in the office in 3-4 weeks and we'll check her LFTs then.   She was counseled about recurrent abdominal pain fever nausea or vomiting and to call us promptly if that happens as an outpatient.  Angelia Mould. Derrell Lolling, M.D., [facility] General and Minimally invasive Surgery Breast and Colorectal Surgery Office:   [phone] Pager:   [phone]

## 2013-04-13 NOTE — Progress Notes (Signed)
OK after EUS, she still feels rough, had clears for lunch but not more.  After discussion we will plan to leave her here tonight, advance diet and if her labs are OK send home in AM.

## 2013-04-14 ENCOUNTER — Other Ambulatory Visit (INDEPENDENT_AMBULATORY_CARE_PROVIDER_SITE_OTHER): Payer: Self-pay

## 2013-04-14 ENCOUNTER — Encounter (HOSPITAL_COMMUNITY): Payer: Self-pay | Admitting: Gastroenterology

## 2013-04-14 DIAGNOSIS — R748 Abnormal levels of other serum enzymes: Secondary | ICD-10-CM

## 2013-04-14 LAB — COMPREHENSIVE METABOLIC PANEL
ALT: 446 U/L — ABNORMAL HIGH (ref 0–35)
AST: 369 U/L — ABNORMAL HIGH (ref 0–37)
Albumin: 3.6 g/dL (ref 3.5–5.2)
Alkaline Phosphatase: 275 U/L — ABNORMAL HIGH (ref 39–117)
BUN: 8 mg/dL (ref 6–23)
CO2: 29 mEq/L (ref 19–32)
Calcium: 9.6 mg/dL (ref 8.4–10.5)
Chloride: 102 mEq/L (ref 96–112)
Creatinine, Ser: 0.68 mg/dL (ref 0.50–1.10)
GFR calc Af Amer: >90 mL/min (ref >90)
GFR calc non Af Amer: >90 mL/min (ref >90)
Glucose, Bld: 92 mg/dL (ref 70–99)
Potassium: 3.9 mEq/L (ref 3.5–5.1)
Sodium: 141 mEq/L (ref 135–145)
Total Bilirubin: 2.1 mg/dL — ABNORMAL HIGH (ref 0.3–1.2)
Total Protein: 6.7 g/dL (ref 6.0–8.3)

## 2013-04-14 MED ORDER — HYDROCODONE-ACETAMINOPHEN 5-325 MG PO TABS: 1.0000 | ORAL_TABLET | Freq: Four times a day (QID) | ORAL | Status: DC | PRN

## 2013-04-14 NOTE — Discharge Summary (Signed)
  Physician Discharge Summary  Patient ID: Margaret Gregory MRN: [id number] DOB/AGE: 05-26-1991 21 y.o.  Admit date: 04/11/2013 Discharge date: 04/14/2013  Admitting Diagnosis:  Nausea, vomiting and abdominal pain  Cholelithiasis with obstruction   Discharge Diagnosis Same Elevated LFTs Elevated Bilirubin  Consultants None  Procedures Laparoscopic Cholecystectomy with [facility] EUS  Hospital Course 22 yr old female who presented to [facility] with abdominal pain, nausea, vomiting.  Workup showed elevated LFTs and bilirubin and Cholelithiasis with obstruction  Patient was admitted and given IVFs.  GI was consulted but they opted to allow the patient to have surgery prior to performing an ERCP as her LFTs and bilirubin were improving.  She underwent procedure listed above and was found to have no contrast escaping from the CBD.  Tolerated procedure well and was transferred to the floor.  Diet was advanced as tolerated.  On POD#1, GI saw the patient and ordered a EUS which did not show an obstruction.  The next day the patients LFTs and bilirubin had increased but she was doing much better and feeling better.  We decided to allow the patient to go home but with follow up LFTs and bilirubin at her follow up appointment.  At time of discharge, the patient was voiding well, tolerating diet, ambulating well, pain well controlled, vital signs stable, incisions c/d/i and felt stable for discharge home.  Patient will follow up in our office in 2 weeks and knows to call with questions or concerns.    Medication List         docusate sodium 100 MG capsule  Commonly known as:  COLACE  Take 100 mg by mouth 2 (two) times daily.     HYDROcodone-acetaminophen 5-325 MG per tablet  Commonly known as:  NORCO  Take 1-2 tablets by mouth every 6 (six) hours as needed for pain.     ibuprofen 600 MG tablet  Commonly known as:  ADVIL,MOTRIN  Take 1 tablet (600 mg total) by mouth every 6 (six) hours as needed for  pain.     prenatal multivitamin Tabs  Take 1 tablet by mouth daily at 12 noon.             Follow-up Information   Follow up with [facility] On 05/03/2013. (Arrive at 11:45 to fill out new patient forms, be sure you go to [facility] medical center the day before your appt to get your labs drawn.)    Contact information:   [address]   [place] [address] [phone]       Signed: Denny Levy [facility] [phone]  04/14/2013, 9:49 AM

## 2013-04-14 NOTE — Discharge Instructions (Signed)
[facility] ______[facility] °LAPAROSCOPIC SURGERY: POST OP INSTRUCTIONS °Always review your discharge instruction sheet given to you by the facility where your surgery was performed. °IF YOU HAVE DISABILITY OR FAMILY LEAVE FORMS, YOU MUST BRING THEM TO THE OFFICE FOR PROCESSING.   °DO NOT GIVE THEM TO YOUR DOCTOR. ° °1. A prescription for pain medication may be given to you upon discharge.  Take your pain medication as prescribed, if needed.  If narcotic pain medicine is not needed, then you may take acetaminophen (Tylenol) or ibuprofen (Advil) as needed. °2. Take your usually prescribed medications unless otherwise directed. °3. If you need a refill on your pain medication, please contact your pharmacy.  They will contact our office to request authorization. Prescriptions will not be filled after 5pm or on week-ends. °4. You should follow a light diet the first few days after arrival home, such as soup and crackers, etc.  Be sure to include lots of fluids daily. °5. Most patients will experience some swelling and bruising in the area of the incisions.  Ice packs will help.  Swelling and bruising can take several days to resolve.  °6. It is common to experience some constipation if taking pain medication after surgery.  Increasing fluid intake and taking a stool softener (such as Colace) will usually help or prevent this problem from occurring.  A mild laxative (Milk of Magnesia or Miralax) should be taken according to package instructions if there are no bowel movements after 48 hours. °7. Unless discharge instructions indicate otherwise, you may remove your bandages 24-48 hours after surgery, and you may shower at that time.  You may have steri-strips (small skin tapes) in place directly over the incision.  These strips should be left on the skin for 7-10 days.  If your surgeon used skin glue on the incision, you may shower in 24 hours.  The glue will flake off over the next 2-3 weeks.  Any sutures or  staples will be removed at the office during your follow-up visit. °8. ACTIVITIES:  You may resume regular (light) daily activities beginning the next day--such as daily self-care, walking, climbing stairs--gradually increasing activities as tolerated.  You may have sexual intercourse when it is comfortable.  Refrain from any heavy lifting or straining until approved by your doctor. °a. You may drive when you are no longer taking prescription pain medication, you can comfortably wear a seatbelt, and you can safely maneuver your car and apply brakes. °b. RETURN TO WORK:  __________________________________________________________ °9. You should see your doctor in the office for a follow-up appointment approximately 2-3 weeks after your surgery.  Make sure that you call for this appointment within a day or two after you arrive home to insure a convenient appointment time. °10. OTHER INSTRUCTIONS: __________________________________________________________________________________________________________________________ __________________________________________________________________________________________________________________________ °WHEN TO CALL YOUR DOCTOR: °1. Fever over 101.0 °2. Inability to urinate °3. Continued bleeding from incision. °4. Increased pain, redness, or drainage from the incision. °5. Increasing abdominal pain ° °The clinic staff is available to answer your questions during regular business hours.  Please don’t hesitate to call and ask to speak to one of the nurses for clinical concerns.  If you have a medical emergency, go to the nearest emergency room or call 911.  A surgeon from [facility] is always on call at the hospital. °[address], [address], [place], [place]  [address] ? [address] [address], [place], [place]   [address] °[phone] ? [phone] ? FAX [phone] °Web site:   [email or web address] °

## 2013-04-14 NOTE — Progress Notes (Signed)
Patient ID: Margaret Gregory, female   DOB: February 28, 1991, 22 y.o.   MRN: [id number] 1 Day Post-Op  Subjective: States she feels much better today, less pain, denies n/v, wants to go home.  Objective: Vital signs in last 24 hours: Temp:  [97.6 F (36.4 C)-99 F (37.2 C)] 97.9 F (36.6 C) (07/31 0517) Pulse Rate:  [62-68] 63 (07/31 0517) Resp:  [12-18] 18 (07/31 0517) BP: (93-110)/(44-73) 98/63 mmHg (07/31 0517) SpO2:  [96 %-99 %] 96 % (07/31 0517) Last BM Date: 04/10/13  Intake/Output from previous day: 07/30 0701 - 07/31 0700 In: -  Out: 2 [Urine:2] Intake/Output this shift:    PE: Abd: soft, mildly tender over incision but otherwise benign, +BS, incisions are c/d/i General: NAD  Lab Results:   Recent Labs  04/12/13 0500 04/13/13 0520  WBC 4.8 7.9  HGB 10.4* 10.6*  HCT 32.3* 33.2*  PLT 242 262   BMET  Recent Labs  04/13/13 0520 04/14/13 0525  NA 137 141  K 4.1 3.9  CL 100 102  CO2 26 29  GLUCOSE 74 92  BUN 5* 8  CREATININE 0.71 0.68  CALCIUM 9.1 9.6   PT/INR No results found for this basename: LABPROT, INR,  in the last 72 hours CMP     Component Value Date/Time   NA 141 04/14/2013 0525   K 3.9 04/14/2013 0525   CL 102 04/14/2013 0525   CO2 29 04/14/2013 0525   GLUCOSE 92 04/14/2013 0525   BUN 8 04/14/2013 0525   CREATININE 0.68 04/14/2013 0525   CALCIUM 9.6 04/14/2013 0525   PROT 6.7 04/14/2013 0525   ALBUMIN 3.6 04/14/2013 0525   AST 369* 04/14/2013 0525   ALT 446* 04/14/2013 0525   ALKPHOS 275* 04/14/2013 0525   BILITOT 2.1* 04/14/2013 0525   GFRNONAA >90 04/14/2013 0525   GFRAA >90 04/14/2013 0525   Lipase     Component Value Date/Time   LIPASE 22 04/12/2013 0500       Studies/Results: Dg Cholangiogram Operative  04/12/2013   *RADIOLOGY REPORT*  Clinical data:  Cholelithiasis.  Laparoscopic cholecystectomy.  INTRAOPERATIVE CHOLANGIOGRAM  Comparison: Abdominal ultrasound yesterday.  Findings:  2 series of cholangiographic images from the C-arm  fluoroscopic device were submitted for interpretation post- operatively.  The cannula is present in the cystic duct remnant with excellent opacification of the common bile duct, common hepatic duct, and proximal intrahepatic ducts.  No filling defects are identified within the bile duct.  However, on neither series did contrast enter the duodenum.  There is no extravasation at the injection site.  There is no intra or extrahepatic biliary ductal dilation.  The radiologic technologist documented 36 seconds of fluoroscopy time.  Please correlate with findings at real time fluoroscopy.  IMPRESSION: No visible common bile duct stones, though contrast did not enter the duodenum.  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  No biliary ductal dilation.  Should the liver function tests remain elevated post-operatively, follow-up imaging with MRCP or ERCP may be helpful.  These images were submitted for radiologic interpretation only. Please see the procedural report for the amount of contrast and the fluoroscopy time utilized.   Original Report Authenticated By: Hulan Saas, M.D.    Anti-infectives: Anti-infectives   Start     Dose/Rate Route Frequency Ordered Stop   04/13/13 0910  ceFAZolin (ANCEF) IVPB 2 g/50 mL premix     2 g 100 mL/hr over 30 Minutes Intravenous On call to O.R. 04/13/13 4098 04/14/13 0559  04/12/13 1500  Ampicillin-Sulbactam (UNASYN) 3 g in sodium chloride 0.9 % 100 mL IVPB     3 g 100 mL/hr over 60 Minutes Intravenous Every 6 hours 04/12/13 1428 04/13/13 0337   04/12/13 1130  ceFAZolin (ANCEF) IVPB 2 g/50 mL premix     2 g 100 mL/hr over 30 Minutes Intravenous  Once 04/12/13 1231 04/12/13 1130   04/11/13 1200  Ampicillin-Sulbactam (UNASYN) 3 g in sodium chloride 0.9 % 100 mL IVPB  Status:  Discontinued     3 g 100 mL/hr over 60 Minutes Intravenous Every 6 hours 04/11/13 1156 04/12/13 1610       Assessment/Plan POD#2-lap cholecystectomy: was planning for patient to go home this am  but labs just returned showing increase in LFTs and bilirubin, will discuss with Dr. Derrell Lolling as to whether it is appropriate to send her home with recheck of LFTs and bili or if we should monitor her another day.      LOS: 3 days    Margaret Gregory 04/14/2013

## 2013-04-14 NOTE — Discharge Summary (Signed)
I have personally interviewed and examined this patient today. I discussed her discharge diet and activities and followup plans. I agree with the assessment and discharge plans as outlined by Doristine Mango,., PA   Angelia Mould. Derrell Lolling, M.D., [facility] General and Minimally invasive Surgery Breast and Colorectal Surgery Office:   [phone] Pager:   [phone]

## 2013-05-03 ENCOUNTER — Ambulatory Visit (INDEPENDENT_AMBULATORY_CARE_PROVIDER_SITE_OTHER): Payer: Medicaid Other | Admitting: Internal Medicine

## 2013-05-03 ENCOUNTER — Encounter (INDEPENDENT_AMBULATORY_CARE_PROVIDER_SITE_OTHER): Payer: Self-pay

## 2013-05-03 VITALS — BP 100/68 | HR 64 | Temp 97.8°F | Resp 16 | Ht 66.0 in | Wt 125.6 lb

## 2013-05-03 DIAGNOSIS — K8021 Calculus of gallbladder without cholecystitis with obstruction: Secondary | ICD-10-CM

## 2013-05-03 LAB — COMPREHENSIVE METABOLIC PANEL
ALT: 27 U/L (ref 0–35)
AST: 23 U/L (ref 0–37)
Albumin: 5 g/dL (ref 3.5–5.2)
Alkaline Phosphatase: 131 U/L — ABNORMAL HIGH (ref 39–117)
BUN: 10 mg/dL (ref 6–23)
CO2: 26 mEq/L (ref 19–32)
Calcium: 10.2 mg/dL (ref 8.4–10.5)
Chloride: 100 mEq/L (ref 96–112)
Creat: 0.63 mg/dL (ref 0.50–1.10)
Glucose, Bld: 70 mg/dL (ref 70–99)
Potassium: 4.1 mEq/L (ref 3.5–5.3)
Sodium: 138 mEq/L (ref 135–145)
Total Bilirubin: 0.7 mg/dL (ref 0.3–1.2)
Total Protein: 7.7 g/dL (ref 6.0–8.3)

## 2013-05-03 NOTE — Progress Notes (Signed)
  Subjective: Pt returns to the clinic today after undergoing laparoscopic cholecystectomy on 04/12/13 by Dr. Derrell Lolling.  The patient is tolerating their diet well and is having no severe pain.  Bowel function is good.  No problems with the wounds.  She went for lab work today to follow up her LFTs.  Objective: Vital signs in last 24 hours: Reviewed  PE: Abd: soft, non-tender, +bs, incisions well healed  Lab Results:  No results found for this basename: WBC, HGB, HCT, PLT,  in the last 72 hours BMET  Recent Labs  05/02/13 1130  NA 138  K 4.1  CL 100  CO2 26  GLUCOSE 70  BUN 10  CREATININE 0.63  CALCIUM 10.2   PT/INR No results found for this basename: LABPROT, INR,  in the last 72 hours CMP     Component Value Date/Time   NA 138 05/02/2013 1130   K 4.1 05/02/2013 1130   CL 100 05/02/2013 1130   CO2 26 05/02/2013 1130   GLUCOSE 70 05/02/2013 1130   BUN 10 05/02/2013 1130   CREATININE 0.63 05/02/2013 1130   CREATININE 0.68 04/14/2013 0525   CALCIUM 10.2 05/02/2013 1130   PROT 7.7 05/02/2013 1130   ALBUMIN 5.0 05/02/2013 1130   AST 23 05/02/2013 1130   ALT 27 05/02/2013 1130   ALKPHOS 131* 05/02/2013 1130   BILITOT 0.7 05/02/2013 1130   GFRNONAA >90 04/14/2013 0525   GFRAA >90 04/14/2013 0525   Lipase     Component Value Date/Time   LIPASE 22 04/12/2013 0500       Studies/Results: No results found.  Anti-infectives: Anti-infectives   None       Assessment/Plan  1.  S/P Laparoscopic Cholecystectomy: doing well, her LFTs have returned to normal now, may resume regular activity without restrictions, Pt will follow up with [place] PRN and knows to call with questions or concerns.     Margaret Gregory 05/03/2013

## 2013-05-03 NOTE — Patient Instructions (Addendum)
May resume regular activity without restrictions. Follow up as needed. Call with questions or concerns.  

## 2013-06-06 ENCOUNTER — Ambulatory Visit: Payer: Medicaid Other | Admitting: Obstetrics & Gynecology

## 2013-06-06 VITALS — BP 112/73 | HR 84 | Temp 98.2°F | Ht 66.0 in | Wt 127.0 lb

## 2013-06-06 DIAGNOSIS — Z3009 Encounter for other general counseling and advice on contraception: Secondary | ICD-10-CM

## 2013-06-06 DIAGNOSIS — Z30011 Encounter for initial prescription of contraceptive pills: Secondary | ICD-10-CM | POA: Insufficient documentation

## 2013-06-06 MED ORDER — NORETHINDRONE 0.35 MG PO TABS: 1.0000 | ORAL_TABLET | Freq: Every day | ORAL | Status: DC

## 2013-06-06 NOTE — Progress Notes (Unsigned)
Patient ID: Loan Oguin, female   DOB: 1991-08-15, 22 y.o.   MRN: [id number] Subjective:     Margaret Gregory is a 22 y.o. female who presents for a postpartum visit. She is 3 months postpartum following a spontaneous vaginal delivery. I have fully reviewed the prenatal and intrapartum course. The delivery was at 40 gestational weeks. Outcome: spontaneous vaginal delivery. Anesthesia: epidural. Postpartum course has been good . Baby's course has been good. Baby is feeding by breast. Bleeding no bleeding. Bowel function is normal. Bladder function is normal. Patient is sexually active. Contraception method is condoms. Postpartum depression screening: negative.  The following portions of the patient's history were reviewed and updated as appropriate: allergies, current medications, past family history, past medical history, past social history, past surgical history and problem list. Past Surgical History  Procedure Laterality Date  . No past surgeries    . Cholecystectomy N/A 04/12/2013    Procedure: LAPAROSCOPIC CHOLECYSTECTOMY WITH INTRAOPERATIVE CHOLANGIOGRAM;  Surgeon: Ernestene Mention, MD;  Location: [facility];  Service: General;  Laterality: N/A;  . Eus N/A 04/13/2013    Procedure: ESOPHAGEAL ENDOSCOPIC ULTRASOUND (EUS) RADIAL;  Surgeon: Theda Belfast, MD;  Location: [facility];  Service: Endoscopy;  Laterality: N/A;    Review of Systems Pertinent items are noted in HPI.   Objective:    BP 112/73  Pulse 84  Temp(Src) 98.2 F (36.8 C) (Oral)  Ht 5\' 6"  (1.676 m)  Wt 127 lb (57.607 kg)  BMI 20.51 kg/m2  Breastfeeding? Yes   NAD, pleasant   Breasts:     Lungs:    Heart:     Abdomen:     Vulva:  not evaluated  Vagina: not evaluated  Cervix:     Corpus: not examined  Adnexa:  not evaluated  Rectal Exam: Not performed.        Assessment:     normal postpartum exam. Pap smear not done at today's visit.   Plan:    1. Contraception: oral progesterone-only contraceptive 2.  Continue breastfeeding 1 year 3. Follow up in: 6 months or as needed.   Adam Phenix, MD 06/06/2013

## 2013-07-21 ENCOUNTER — Other Ambulatory Visit: Payer: Self-pay

## 2013-09-15 NOTE — L&D Delivery Note (Signed)
Delivery Note At 6:53 PM a viable and healthy female was delivered via Vaginal, Spontaneous Delivery (Presentation: ; Occiput Anterior).  APGAR: 9, 9; weight pending .   Placenta status: Intact, Spontaneous.  Cord: 3 vessels with the following complications: .    Anesthesia: Epidural  Episiotomy: None Lacerations: 1st degree Right and left labial, hemostatic, not repaired Suture Repair: [id number]. Blood Loss (mL): 550  Mom to postpartum.  Baby to Couplet care / Skin to Skin.  G2P2002  admitted for SOL progressed within a few hours from admission to complete, felt a strong urge to push, and delivered female infant in 3 pushes. FOB assisted with delivery after anterior shoulder delivered, per pt request. Perineum mostly intact with 2 small labial tears that were hemostatic and not repaired.  Infant placed in mother's arms, cord clamping delayed by a few minutes.  Cord clamped by CNM and cut by FOB.  Placenta intact, delivered spontaneously with light cord traction.  Pitocin bolus via IV following delivery of placenta.  Uterus boggy following delivery, requiring bimanual exam/massage and removal of large clots from cervical os.  Bleeding slowed but continued to be moderate amount so Cytotec 800 mcg placed rectally.  Bleeding slowed and mom and baby stable, with skin to skin x 1 hour after delivery.    LEFTWICH-KIRBY, LISA 05/14/2014, 7:47 PM

## 2013-09-21 ENCOUNTER — Ambulatory Visit: Payer: Medicaid Other

## 2013-09-30 ENCOUNTER — Ambulatory Visit (INDEPENDENT_AMBULATORY_CARE_PROVIDER_SITE_OTHER): Payer: Medicaid Other | Admitting: *Deleted

## 2013-09-30 DIAGNOSIS — N926 Irregular menstruation, unspecified: Secondary | ICD-10-CM

## 2013-09-30 DIAGNOSIS — Z3480 Encounter for supervision of other normal pregnancy, unspecified trimester: Secondary | ICD-10-CM

## 2013-09-30 DIAGNOSIS — O210 Mild hyperemesis gravidarum: Secondary | ICD-10-CM

## 2013-09-30 DIAGNOSIS — Z3490 Encounter for supervision of normal pregnancy, unspecified, unspecified trimester: Secondary | ICD-10-CM

## 2013-09-30 LAB — POCT PREGNANCY, URINE: Preg Test, Ur: POSITIVE — AB

## 2013-09-30 MED ORDER — ONDANSETRON HCL 4 MG PO TABS: 4.0000 mg | ORAL_TABLET | Freq: Three times a day (TID) | ORAL | Status: DC | PRN

## 2013-09-30 NOTE — Progress Notes (Signed)
Here for pregnancy test[- which was positive.  States had a baby 7 months ago, is breastfeeding, and had not had a period yet , only spotting one day.  Would like to receive pregnancy care here.

## 2013-09-30 NOTE — Addendum Note (Signed)
Addended by: Franchot MimesALFARO, Amiel Sharrow on: 09/30/2013 11:55 AM   Modules accepted: Orders

## 2013-10-01 LAB — OBSTETRIC PANEL
Antibody Screen: NEGATIVE
BASOS ABS: 0 10*3/uL (ref 0.0–0.1)
Basophils Relative: 0 % (ref 0–1)
Eosinophils Absolute: 0.1 10*3/uL (ref 0.0–0.7)
Eosinophils Relative: 1 % (ref 0–5)
HCT: 37.4 % (ref 36.0–46.0)
HEP B S AG: NEGATIVE
Hemoglobin: 12.8 g/dL (ref 12.0–15.0)
LYMPHS ABS: 1.5 10*3/uL (ref 0.7–4.0)
LYMPHS PCT: 16 % (ref 12–46)
MCH: 27.1 pg (ref 26.0–34.0)
MCHC: 34.2 g/dL (ref 30.0–36.0)
MCV: 79.1 fL (ref 78.0–100.0)
Monocytes Absolute: 0.5 10*3/uL (ref 0.1–1.0)
Monocytes Relative: 6 % (ref 3–12)
NEUTROS ABS: 7.2 10*3/uL (ref 1.7–7.7)
Neutrophils Relative %: 77 % (ref 43–77)
PLATELETS: 267 10*3/uL (ref 150–400)
RBC: 4.73 MIL/uL (ref 3.87–5.11)
RDW: 14.4 % (ref 11.5–15.5)
RUBELLA: 2.44 {index} — AB (ref <0.90)
Rh Type: POSITIVE
WBC: 9.3 10*3/uL (ref 4.0–10.5)

## 2013-10-01 LAB — HIV ANTIBODY (ROUTINE TESTING W REFLEX): HIV: NONREACTIVE

## 2013-10-03 LAB — PRESCRIPTION MONITORING PROFILE (19 PANEL)
AMPHETAMINE/METH: NEGATIVE ng/mL
BARBITURATE SCREEN, URINE: NEGATIVE ng/mL
BENZODIAZEPINE SCREEN, URINE: NEGATIVE ng/mL
BUPRENORPHINE, URINE: NEGATIVE ng/mL
CANNABINOID SCRN UR: NEGATIVE ng/mL
CARISOPRODOL, URINE: NEGATIVE ng/mL
Cocaine Metabolites: NEGATIVE ng/mL
Creatinine, Urine: 262.21 mg/dL (ref >20.0)
Fentanyl, Ur: NEGATIVE ng/mL
MDMA URINE: NEGATIVE ng/mL
METHADONE SCREEN, URINE: NEGATIVE ng/mL
METHAQUALONE SCREEN (URINE): NEGATIVE ng/mL
Meperidine, Ur: NEGATIVE ng/mL
NITRITES URINE, INITIAL: NEGATIVE ug/mL
OPIATE SCREEN, URINE: NEGATIVE ng/mL
Oxycodone Screen, Ur: NEGATIVE ng/mL
PROPOXYPHENE: NEGATIVE ng/mL
Phencyclidine, Ur: NEGATIVE ng/mL
TAPENTADOLUR: NEGATIVE ng/mL
Tramadol Scrn, Ur: NEGATIVE ng/mL
ZOLPIDEM, URINE: NEGATIVE ng/mL
pH, Initial: 6.7 pH (ref 4.5–8.9)

## 2013-10-07 ENCOUNTER — Ambulatory Visit (HOSPITAL_COMMUNITY)
Admission: RE | Admit: 2013-10-07 | Discharge: 2013-10-07 | Disposition: A | Discharge status: Home / Self Care | Payer: Medicaid Other | Source: Ambulatory Visit | Attending: Obstetrics & Gynecology | Admitting: Obstetrics & Gynecology

## 2013-10-07 DIAGNOSIS — O468X9 Other antepartum hemorrhage, unspecified trimester: Secondary | ICD-10-CM | POA: Insufficient documentation

## 2013-11-02 ENCOUNTER — Ambulatory Visit (INDEPENDENT_AMBULATORY_CARE_PROVIDER_SITE_OTHER): Payer: Medicaid Other | Admitting: Obstetrics and Gynecology

## 2013-11-02 ENCOUNTER — Encounter: Payer: Self-pay | Admitting: Obstetrics and Gynecology

## 2013-11-02 VITALS — BP 108/74 | Wt 122.7 lb

## 2013-11-02 DIAGNOSIS — O210 Mild hyperemesis gravidarum: Secondary | ICD-10-CM

## 2013-11-02 DIAGNOSIS — Z3490 Encounter for supervision of normal pregnancy, unspecified, unspecified trimester: Secondary | ICD-10-CM

## 2013-11-02 DIAGNOSIS — O09891 Supervision of other high risk pregnancies, first trimester: Secondary | ICD-10-CM | POA: Insufficient documentation

## 2013-11-02 DIAGNOSIS — O219 Vomiting of pregnancy, unspecified: Secondary | ICD-10-CM

## 2013-11-02 DIAGNOSIS — O09899 Supervision of other high risk pregnancies, unspecified trimester: Secondary | ICD-10-CM

## 2013-11-02 LAB — OB RESULTS CONSOLE GC/CHLAMYDIA
Chlamydia: NEGATIVE
Gonorrhea: NEGATIVE

## 2013-11-02 LAB — POCT URINALYSIS DIP (DEVICE)
Bilirubin Urine: NEGATIVE
Glucose, UA: NEGATIVE mg/dL
Hgb urine dipstick: NEGATIVE
Ketones, ur: NEGATIVE mg/dL
Leukocytes, UA: NEGATIVE
Nitrite: NEGATIVE
Protein, ur: NEGATIVE mg/dL
Specific Gravity, Urine: 1.025 (ref 1.005–1.030)
Urobilinogen, UA: 0.2 mg/dL (ref 0.0–1.0)
pH: 5.5 (ref 5.0–8.0)

## 2013-11-02 MED ORDER — ONDANSETRON HCL 4 MG PO TABS: 4.0000 mg | ORAL_TABLET | Freq: Three times a day (TID) | ORAL | Status: DC | PRN

## 2013-11-02 NOTE — Progress Notes (Signed)
Pulse: 110 [place]  Patient had baby in June and is still breast feeding.

## 2013-11-02 NOTE — Progress Notes (Signed)
Subjective:    Margaret Gregory is a G2P1001 8173w6d by 8 wk US (amenorrheic since delivery 8 months ago and still nursing 5-6 times per day). She is being seen today for her first obstetrical visit.  Her obstetrical history is significant for short interval b/t pregnacies.  Patient does intend to breast feed. Pregnancy history fully reviewed.  Patient reports nausea. Zofran helps and vomits occasionally. Concerned about nutrition to fetus due to breastfeeding (no plan to wean yet).    Filed Vitals:   11/02/13 0816  BP: 108/74  Weight: 122 lb 11.2 oz (55.656 kg)    HISTORY: OB History  Gravida Para Term Preterm AB SAB TAB Ectopic Multiple Living  2 1 1  0 0 0 0 0 0 1    # Outcome Date GA Lbr Len/2nd Weight Sex Delivery Anes PTL Lv  2 CUR           1 TRM 03/05/13 1578w2d 21:31 / 03:22 7 lb 9 oz (3.43 kg) M SVD EPI  Y     Past Medical History  Diagnosis Date  . Chronic kidney disease     caused by apples and hot dogs   Past Surgical History  Procedure Laterality Date  . No past surgeries    . Cholecystectomy N/A 04/12/2013    Procedure: LAPAROSCOPIC CHOLECYSTECTOMY WITH INTRAOPERATIVE CHOLANGIOGRAM;  Surgeon: Ernestene MentionHaywood M Ingram, MD;  Location: [facility];  Service: General;  Laterality: N/A;  . Eus N/A 04/13/2013    Procedure: ESOPHAGEAL ENDOSCOPIC ULTRASOUND (EUS) RADIAL;  Surgeon: Theda BelfastPatrick D Hung, MD;  Location: [facility];  Service: Endoscopy;  Laterality: N/A;   Family History  Problem Relation Age of Onset  . Cancer Maternal Uncle     lymphoma  . Cancer Maternal Grandmother     bresat     Exam    Uterus:   12 wk size  Pelvic Exam:    Perineum: No Hemorrhoids, Normal Perineum   Vulva: normal, Bartholin's, Urethra, Skene's normal   Vagina:  normal mucosa, normal discharge       Cervix: no bleeding following Pap   Adnexa: not evaluated   Bony Pelvis: average  System: Breast:  normal appearance, no masses or tenderness   Skin: normal coloration and turgor, no rashes     Neurologic: oriented, normal, grossly non-focal   Extremities: normal strength, tone, and muscle mass   HEENT PERRLA   Mouth/Teeth mucous membranes moist, pharynx normal without lesions and dental hygiene good   Neck supple and no masses   Cardiovascular: regular rate and rhythm, no murmurs or gallops   Respiratory:  appears well, vitals normal, no respiratory distress, acyanotic, normal RR, ear and throat exam is normal, neck free of mass or lymphadenopathy, chest clear, no wheezing, crepitations, rhonchi, normal symmetric air entry   Abdomen: soft, NT   Urinary: urethral meatus normal      Assessment:    Pregnancy: G2P1001 Patient Active Problem List   Diagnosis Date Noted  . Short interval between pregnancies complicating pregnancy in first trimester, antepartum 11/02/2013  . Nausea and vomiting in pregnancy prior to [redacted] weeks gestation 11/02/2013  . Oral contraceptive prescribed 06/06/2013  Borderline hgb. 10.4      Plan:     Initial labs drawn. Prenatal vitamins. Problem list reviewed and updated. Genetic Screening discussed Integrated Screen: declined.  Ultrasound discussed; fetal survey: requested.  Follow up in 4 weeks. 50% of 30 min visit spent on counseling and coordination of care.  May try OTC protein  supplements, diet discussed. Increase iron foods.    Tyniesha Howald 11/02/2013

## 2013-11-02 NOTE — Patient Instructions (Signed)
Pregnancy - First Trimester  During sexual intercourse, millions of sperm go into the vagina. Only 1 sperm will penetrate and fertilize the female egg while it is in the Fallopian tube. One week later, the fertilized egg implants into the wall of the uterus. An embryo begins to develop into a baby. At 6 to 8 weeks, the eyes and face are formed and the heartbeat can be seen on ultrasound. At the end of 12 weeks (first trimester), all the baby's organs are formed. Now that you are pregnant, you will want to do everything you can to have a healthy baby. Two of the most important things are to get good prenatal care and follow your caregiver's instructions. Prenatal care is all the medical care you receive before the baby's birth. It is given to prevent, find, and treat problems during the pregnancy and childbirth.  PRENATAL EXAMS  · During prenatal visits, your weight, blood pressure, and urine are checked. This is done to make sure you are healthy and progressing normally during the pregnancy.  · A pregnant woman should gain 25 to 35 pounds during the pregnancy. However, if you are overweight or underweight, your caregiver will advise you regarding your weight.  · Your caregiver will ask and answer questions for you.  · Blood work, cervical cultures, other necessary tests, and a Pap test are done during your prenatal exams. These tests are done to check on your health and the probable health of your baby. Tests are strongly recommended and done for HIV with your permission. This is the virus that causes AIDS. These tests are done because medicines can be given to help prevent your baby from being born with this infection should you have been infected without knowing it. Blood work is also used to find out your blood type, previous infections, and follow your blood levels (hemoglobin).  · Low hemoglobin (anemia) is common during pregnancy. Iron and vitamins are given to help prevent this. Later in the pregnancy, blood  tests for diabetes will be done along with any other tests if any problems develop.  · You may need other tests to make sure you and the baby are doing well.  CHANGES DURING THE FIRST TRIMESTER   Your body goes through many changes during pregnancy. They vary from person to person. Talk to your caregiver about changes you notice and are concerned about. Changes can include:  · Your menstrual period stops.  · The egg and sperm carry the genes that determine what you look like. Genes from you and your partner are forming a baby. The female genes determine whether the baby is a boy or a girl.  · Your body increases in girth and you may feel bloated.  · Feeling sick to your stomach (nauseous) and throwing up (vomiting). If the vomiting is uncontrollable, call your caregiver.  · Your breasts will begin to enlarge and become tender.  · Your nipples may stick out more and become darker.  · The need to urinate more. Painful urination may mean you have a bladder infection.  · Tiring easily.  · Loss of appetite.  · Cravings for certain kinds of food.  · At first, you may gain or lose a couple of pounds.  · You may have changes in your emotions from day to day (excited to be pregnant or concerned something may go wrong with the pregnancy and baby).  · You may have more vivid and strange dreams.  HOME CARE INSTRUCTIONS   ·   It is very important to avoid all smoking, alcohol and non-prescribed drugs during your pregnancy. These affect the formation and growth of the baby. Avoid chemicals while pregnant to ensure the delivery of a healthy infant.  · Start your prenatal visits by the 12th week of pregnancy. They are usually scheduled monthly at first, then more often in the last 2 months before delivery. Keep your caregiver's appointments. Follow your caregiver's instructions regarding medicine use, blood and lab tests, exercise, and diet.  · During pregnancy, you are providing food for you and your baby. Eat regular, well-balanced  meals. Choose foods such as meat, fish, milk and other low fat dairy products, vegetables, fruits, and whole-grain breads and cereals. Your caregiver will tell you of the ideal weight gain.  · You can help morning sickness by keeping soda crackers at the bedside. Eat a couple before arising in the morning. You may want to use the crackers without salt on them.  · Eating 4 to 5 small meals rather than 3 large meals a day also may help the nausea and vomiting.  · Drinking liquids between meals instead of during meals also seems to help nausea and vomiting.  · A physical sexual relationship may be continued throughout pregnancy if there are no other problems. Problems may be early (premature) leaking of amniotic fluid from the membranes, vaginal bleeding, or belly (abdominal) pain.  · Exercise regularly if there are no restrictions. Check with your caregiver or physical therapist if you are unsure of the safety of some of your exercises. Greater weight gain will occur in the last 2 trimesters of pregnancy. Exercising will help:  · Control your weight.  · Keep you in shape.  · Prepare you for labor and delivery.  · Help you lose your pregnancy weight after you deliver your baby.  · Wear a good support or jogging bra for breast tenderness during pregnancy. This may help if worn during sleep too.  · Ask when prenatal classes are available. Begin classes when they are offered.  · Do not use hot tubs, steam rooms, or saunas.  · Wear your seat belt when driving. This protects you and your baby if you are in an accident.  · Avoid raw meat, uncooked cheese, cat litter boxes, and soil used by cats throughout the pregnancy. These carry germs that can cause birth defects in the baby.  · The first trimester is a good time to visit your dentist for your dental health. Getting your teeth cleaned is okay. Use a softer toothbrush and brush gently during pregnancy.  · Ask for help if you have financial, counseling, or nutritional needs  during pregnancy. Your caregiver will be able to offer counseling for these needs as well as refer you for other special needs.  · Do not take any medicines or herbs unless told by your caregiver.  · Inform your caregiver if there is any mental or physical domestic violence.  · Make a list of emergency phone numbers of family, friends, hospital, and police and fire departments.  · Write down your questions. Take them to your prenatal visit.  · Do not douche.  · Do not cross your legs.  · If you have to stand for long periods of time, rotate you feet or take small steps in a circle.  · You may have more vaginal secretions that may require a sanitary pad. Do not use tampons or scented sanitary pads.  MEDICINES AND DRUG USE IN PREGNANCY  ·   Take prenatal vitamins as directed. The vitamin should contain 1 milligram of folic acid. Keep all vitamins out of reach of children. Only a couple vitamins or tablets containing iron may be fatal to a baby or young child when ingested.  · Avoid use of all medicines, including herbs, over-the-counter medicines, not prescribed or suggested by your caregiver. Only take over-the-counter or prescription medicines for pain, discomfort, or fever as directed by your caregiver. Do not use aspirin, ibuprofen, or naproxen unless directed by your caregiver.  · Let your caregiver also know about herbs you may be using.  · Alcohol is related to a number of birth defects. This includes fetal alcohol syndrome. All alcohol, in any form, should be avoided completely. Smoking will cause low birth rate and premature babies.  · Street or illegal drugs are very harmful to the baby. They are absolutely forbidden. A baby born to an addicted mother will be addicted at birth. The baby will go through the same withdrawal an adult does.  · Let your caregiver know about any medicines that you have to take and for what reason you take them.  SEEK MEDICAL CARE IF:   You have any concerns or worries during your  pregnancy. It is better to call with your questions if you feel they cannot wait, rather than worry about them.  SEEK IMMEDIATE MEDICAL CARE IF:   · An unexplained oral temperature above 102° F (38.9° C) develops, or as your caregiver suggests.  · You have leaking of fluid from the vagina (birth canal). If leaking membranes are suspected, take your temperature and inform your caregiver of this when you call.  · There is vaginal spotting or bleeding. Notify your caregiver of the amount and how many pads are used.  · You develop a bad smelling vaginal discharge with a change in the color.  · You continue to feel sick to your stomach (nauseated) and have no relief from remedies suggested. You vomit blood or coffee ground-like materials.  · You lose more than 2 pounds of weight in 1 week.  · You gain more than 2 pounds of weight in 1 week and you notice swelling of your face, hands, feet, or legs.  · You gain 5 pounds or more in 1 week (even if you do not have swelling of your hands, face, legs, or feet).  · You get exposed to German measles and have never had them.  · You are exposed to fifth disease or chickenpox.  · You develop belly (abdominal) pain. Round ligament discomfort is a common non-cancerous (benign) cause of abdominal pain in pregnancy. Your caregiver still must evaluate this.  · You develop headache, fever, diarrhea, pain with urination, or shortness of breath.  · You fall or are in a car accident or have any kind of trauma.  · There is mental or physical violence in your home.  Document Released: 08/26/2001 Document Revised: 05/26/2012 Document Reviewed: 02/27/2009  [employer]® Patient Information ©2014 [employer].

## 2013-11-03 LAB — PRESCRIPTION MONITORING PROFILE (19 PANEL)
Amphetamine/Meth: NEGATIVE ng/mL
BENZODIAZEPINE SCREEN, URINE: NEGATIVE ng/mL
BUPRENORPHINE, URINE: NEGATIVE ng/mL
Barbiturate Screen, Urine: NEGATIVE ng/mL
COCAINE METABOLITES: NEGATIVE ng/mL
Cannabinoid Scrn, Ur: NEGATIVE ng/mL
Carisoprodol, Urine: NEGATIVE ng/mL
Creatinine, Urine: 174.48 mg/dL (ref >20.0)
ECSTASY: NEGATIVE ng/mL
FENTANYL URINE: NEGATIVE ng/mL
MEPERIDINE UR: NEGATIVE ng/mL
METHAQUALONE SCREEN (URINE): NEGATIVE ng/mL
Methadone Screen, Urine: NEGATIVE ng/mL
Nitrites, Initial: NEGATIVE ug/mL
Opiate Screen, Urine: NEGATIVE ng/mL
Oxycodone Screen, Ur: NEGATIVE ng/mL
PH URINE, INITIAL: 6.5 pH (ref 4.5–8.9)
Phencyclidine, Ur: NEGATIVE ng/mL
Propoxyphene: NEGATIVE ng/mL
TRAMADOL UR: NEGATIVE ng/mL
Tapentadol, urine: NEGATIVE ng/mL
ZOLPIDEM, URINE: NEGATIVE ng/mL

## 2013-11-03 LAB — GC/CHLAMYDIA PROBE AMP
CT Probe RNA: NEGATIVE
GC Probe RNA: NEGATIVE

## 2013-11-04 LAB — CULTURE, OB URINE

## 2013-11-07 ENCOUNTER — Telehealth: Payer: Self-pay

## 2013-11-07 DIAGNOSIS — N390 Urinary tract infection, site not specified: Secondary | ICD-10-CM

## 2013-11-07 MED ORDER — CEPHALEXIN 500 MG PO CAPS: 500.0000 mg | ORAL_CAPSULE | Freq: Four times a day (QID) | ORAL | Status: DC

## 2013-11-07 NOTE — Telephone Encounter (Signed)
Message copied by Louanna RawAMPBELL, Charleene Callegari M on Mon Nov 07, 2013  7:57 AM ------      Message from: Danae OrleansPOE, DEIRDRE C      Created: Sat Nov 05, 2013  3:23 PM       Call in Rx Keflex 500 qid x 7 ------

## 2013-11-07 NOTE — Telephone Encounter (Signed)
Called pt. And [facility] Pt. Verbalized understanding and gratitude; no questions or concerns.

## 2013-11-07 NOTE — Telephone Encounter (Signed)
Medication ordered. Pt. To be called.

## 2013-11-30 ENCOUNTER — Ambulatory Visit (INDEPENDENT_AMBULATORY_CARE_PROVIDER_SITE_OTHER): Payer: Medicaid Other | Admitting: Family Medicine

## 2013-11-30 VITALS — BP 108/74 | Wt 125.2 lb

## 2013-11-30 DIAGNOSIS — Z3490 Encounter for supervision of normal pregnancy, unspecified, unspecified trimester: Secondary | ICD-10-CM

## 2013-11-30 DIAGNOSIS — O09899 Supervision of other high risk pregnancies, unspecified trimester: Secondary | ICD-10-CM | POA: Insufficient documentation

## 2013-11-30 DIAGNOSIS — Z3480 Encounter for supervision of other normal pregnancy, unspecified trimester: Secondary | ICD-10-CM

## 2013-11-30 DIAGNOSIS — O09891 Supervision of other high risk pregnancies, first trimester: Secondary | ICD-10-CM

## 2013-11-30 LAB — POCT URINALYSIS DIP (DEVICE)
Bilirubin Urine: NEGATIVE
Glucose, UA: NEGATIVE mg/dL
HGB URINE DIPSTICK: NEGATIVE
Ketones, ur: NEGATIVE mg/dL
Leukocytes, UA: NEGATIVE
NITRITE: NEGATIVE
PH: 6 (ref 5.0–8.0)
PROTEIN: NEGATIVE mg/dL
Specific Gravity, Urine: 1.02 (ref 1.005–1.030)
UROBILINOGEN UA: 0.2 mg/dL (ref 0.0–1.0)

## 2013-11-30 NOTE — Progress Notes (Signed)
S: 23 yo G2P1001 @ 2690w6d here for ROBV - doing well - still breast feeding - nausea gone - no vb, lof, abd pain.   O: see flowsheet  A/P - doing well - quad discussed and declined - come back in 4 weeks - US sched today

## 2013-11-30 NOTE — Progress Notes (Signed)
p=70

## 2013-11-30 NOTE — Patient Instructions (Signed)
Second Trimester of Pregnancy The second trimester is from week 13 through week 28, months 4 through 6. The second trimester is often a time when you feel your best. Your body has also adjusted to being pregnant, and you begin to feel better physically. Usually, morning sickness has lessened or quit completely, you may have more energy, and you may have an increase in appetite. The second trimester is also a time when the fetus is growing rapidly. At the end of the sixth month, the fetus is about 9 inches long and weighs about 1 pounds. You will likely begin to feel the baby move (quickening) between 18 and 20 weeks of the pregnancy. BODY CHANGES Your body goes through many changes during pregnancy. The changes vary from woman to woman.   Your weight will continue to increase. You will notice your lower abdomen bulging out.  You may begin to get stretch marks on your hips, abdomen, and breasts.  You may develop headaches that can be relieved by medicines approved by your caregiver.  You may urinate more often because the fetus is pressing on your bladder.  You may develop or continue to have heartburn as a result of your pregnancy.  You may develop constipation because certain hormones are causing the muscles that push waste through your intestines to slow down.  You may develop hemorrhoids or swollen, bulging veins (varicose veins).  You may have back pain because of the weight gain and pregnancy hormones relaxing your joints between the bones in your pelvis and as a result of a shift in weight and the muscles that support your balance.  Your breasts will continue to grow and be tender.  Your gums may bleed and may be sensitive to brushing and flossing.  Dark spots or blotches (chloasma, mask of pregnancy) may develop on your face. This will likely fade after the baby is born.  A dark line from your belly button to the pubic area (linea nigra) may appear. This will likely fade after the  baby is born. WHAT TO EXPECT AT YOUR PRENATAL VISITS During a routine prenatal visit:  You will be weighed to make sure you and the fetus are growing normally.  Your blood pressure will be taken.  Your abdomen will be measured to track your baby's growth.  The fetal heartbeat will be listened to.  Any test results from the previous visit will be discussed. Your caregiver may ask you:  How you are feeling.  If you are feeling the baby move.  If you have had any abnormal symptoms, such as leaking fluid, bleeding, severe headaches, or abdominal cramping.  If you have any questions. Other tests that may be performed during your second trimester include:  Blood tests that check for:  Low iron levels (anemia).  Gestational diabetes (between 24 and 28 weeks).  Rh antibodies.  Urine tests to check for infections, diabetes, or protein in the urine.  An ultrasound to confirm the proper growth and development of the baby.  An amniocentesis to check for possible genetic problems.  Fetal screens for spina bifida and Down syndrome. HOME CARE INSTRUCTIONS   Avoid all smoking, herbs, alcohol, and unprescribed drugs. These chemicals affect the formation and growth of the baby.  Follow your caregiver's instructions regarding medicine use. There are medicines that are either safe or unsafe to take during pregnancy.  Exercise only as directed by your caregiver. Experiencing uterine cramps is a good sign to stop exercising.  Continue to eat regular,   healthy meals.  Wear a good support bra for breast tenderness.  Do not use hot tubs, steam rooms, or saunas.  Wear your seat belt at all times when driving.  Avoid raw meat, uncooked cheese, cat litter boxes, and soil used by cats. These carry germs that can cause birth defects in the baby.  Take your prenatal vitamins.  Try taking a stool softener (if your caregiver approves) if you develop constipation. Eat more high-fiber foods,  such as fresh vegetables or fruit and whole grains. Drink plenty of fluids to keep your urine clear or pale yellow.  Take warm sitz baths to soothe any pain or discomfort caused by hemorrhoids. Use hemorrhoid cream if your caregiver approves.  If you develop varicose veins, wear support hose. Elevate your feet for 15 minutes, 3 4 times a day. Limit salt in your diet.  Avoid heavy lifting, wear low heel shoes, and practice good posture.  Rest with your legs elevated if you have leg cramps or low back pain.  Visit your dentist if you have not gone yet during your pregnancy. Use a soft toothbrush to brush your teeth and be gentle when you floss.  A sexual relationship may be continued unless your caregiver directs you otherwise.  Continue to go to all your prenatal visits as directed by your caregiver. SEEK MEDICAL CARE IF:   You have dizziness.  You have mild pelvic cramps, pelvic pressure, or nagging pain in the abdominal area.  You have persistent nausea, vomiting, or diarrhea.  You have a bad smelling vaginal discharge.  You have pain with urination. SEEK IMMEDIATE MEDICAL CARE IF:   You have a fever.  You are leaking fluid from your vagina.  You have spotting or bleeding from your vagina.  You have severe abdominal cramping or pain.  You have rapid weight gain or loss.  You have shortness of breath with chest pain.  You notice sudden or extreme swelling of your face, hands, ankles, feet, or legs.  You have not felt your baby move in over an hour.  You have severe headaches that do not go away with medicine.  You have vision changes. Document Released: 08/26/2001 Document Revised: 05/04/2013 Document Reviewed: 11/02/2012 [employer] Patient Information 2014 [employer].  

## 2013-12-19 ENCOUNTER — Telehealth: Payer: Self-pay

## 2013-12-19 NOTE — Telephone Encounter (Signed)
Pt called wanted to know what she can take for a cold while pregnant/breastfeeding.

## 2013-12-20 NOTE — Telephone Encounter (Signed)
Called pt and informed pt that she can take Tylenol Allergy, Robtussin Plain and if it is allergies then she take Claritin D or Zyrtec 10mg  once a day.  Pt stated "ok, thank you".

## 2013-12-28 ENCOUNTER — Ambulatory Visit (HOSPITAL_COMMUNITY)
Admission: RE | Admit: 2013-12-28 | Discharge: 2013-12-28 | Disposition: A | Discharge status: Home / Self Care | Payer: Medicaid Other | Source: Ambulatory Visit | Attending: Family Medicine | Admitting: Family Medicine

## 2013-12-28 ENCOUNTER — Ambulatory Visit (INDEPENDENT_AMBULATORY_CARE_PROVIDER_SITE_OTHER): Payer: Medicaid Other | Admitting: Family Medicine

## 2013-12-28 ENCOUNTER — Encounter: Payer: Self-pay | Admitting: Family Medicine

## 2013-12-28 VITALS — BP 106/73 | Temp 97.7°F | Wt 127.2 lb

## 2013-12-28 DIAGNOSIS — O219 Vomiting of pregnancy, unspecified: Secondary | ICD-10-CM

## 2013-12-28 DIAGNOSIS — O09891 Supervision of other high risk pregnancies, first trimester: Secondary | ICD-10-CM

## 2013-12-28 DIAGNOSIS — Z3689 Encounter for other specified antenatal screening: Secondary | ICD-10-CM | POA: Insufficient documentation

## 2013-12-28 DIAGNOSIS — Z3490 Encounter for supervision of normal pregnancy, unspecified, unspecified trimester: Secondary | ICD-10-CM

## 2013-12-28 DIAGNOSIS — O210 Mild hyperemesis gravidarum: Secondary | ICD-10-CM

## 2013-12-28 DIAGNOSIS — O09899 Supervision of other high risk pregnancies, unspecified trimester: Secondary | ICD-10-CM

## 2013-12-28 DIAGNOSIS — Z23 Encounter for immunization: Secondary | ICD-10-CM

## 2013-12-28 LAB — POCT URINALYSIS DIP (DEVICE)
Bilirubin Urine: NEGATIVE
Glucose, UA: NEGATIVE mg/dL
HGB URINE DIPSTICK: NEGATIVE
Ketones, ur: NEGATIVE mg/dL
Leukocytes, UA: NEGATIVE
Nitrite: NEGATIVE
PROTEIN: NEGATIVE mg/dL
SPECIFIC GRAVITY, URINE: 1.015 (ref 1.005–1.030)
UROBILINOGEN UA: 0.2 mg/dL (ref 0.0–1.0)
pH: 6.5 (ref 5.0–8.0)

## 2013-12-28 NOTE — Progress Notes (Signed)
P = 98 

## 2013-12-28 NOTE — Patient Instructions (Signed)
Second Trimester of Pregnancy The second trimester is from week 13 through week 28, months 4 through 6. The second trimester is often a time when you feel your best. Your body has also adjusted to being pregnant, and you begin to feel better physically. Usually, morning sickness has lessened or quit completely, you may have more energy, and you may have an increase in appetite. The second trimester is also a time when the fetus is growing rapidly. At the end of the sixth month, the fetus is about 9 inches long and weighs about 1 pounds. You will likely begin to feel the baby move (quickening) between 18 and 20 weeks of the pregnancy. BODY CHANGES Your body goes through many changes during pregnancy. The changes vary from woman to woman.   Your weight will continue to increase. You will notice your lower abdomen bulging out.  You may begin to get stretch marks on your hips, abdomen, and breasts.  You may develop headaches that can be relieved by medicines approved by your caregiver.  You may urinate more often because the fetus is pressing on your bladder.  You may develop or continue to have heartburn as a result of your pregnancy.  You may develop constipation because certain hormones are causing the muscles that push waste through your intestines to slow down.  You may develop hemorrhoids or swollen, bulging veins (varicose veins).  You may have back pain because of the weight gain and pregnancy hormones relaxing your joints between the bones in your pelvis and as a result of a shift in weight and the muscles that support your balance.  Your breasts will continue to grow and be tender.  Your gums may bleed and may be sensitive to brushing and flossing.  Dark spots or blotches (chloasma, mask of pregnancy) may develop on your face. This will likely fade after the baby is born.  A dark line from your belly button to the pubic area (linea nigra) may appear. This will likely fade after the  baby is born. WHAT TO EXPECT AT YOUR PRENATAL VISITS During a routine prenatal visit:  You will be weighed to make sure you and the fetus are growing normally.  Your blood pressure will be taken.  Your abdomen will be measured to track your baby's growth.  The fetal heartbeat will be listened to.  Any test results from the previous visit will be discussed. Your caregiver may ask you:  How you are feeling.  If you are feeling the baby move.  If you have had any abnormal symptoms, such as leaking fluid, bleeding, severe headaches, or abdominal cramping.  If you have any questions. Other tests that may be performed during your second trimester include:  Blood tests that check for:  Low iron levels (anemia).  Gestational diabetes (between 24 and 28 weeks).  Rh antibodies.  Urine tests to check for infections, diabetes, or protein in the urine.  An ultrasound to confirm the proper growth and development of the baby.  An amniocentesis to check for possible genetic problems.  Fetal screens for spina bifida and Down syndrome. HOME CARE INSTRUCTIONS   Avoid all smoking, herbs, alcohol, and unprescribed drugs. These chemicals affect the formation and growth of the baby.  Follow your caregiver's instructions regarding medicine use. There are medicines that are either safe or unsafe to take during pregnancy.  Exercise only as directed by your caregiver. Experiencing uterine cramps is a good sign to stop exercising.  Continue to eat regular,   healthy meals.  Wear a good support bra for breast tenderness.  Do not use hot tubs, steam rooms, or saunas.  Wear your seat belt at all times when driving.  Avoid raw meat, uncooked cheese, cat litter boxes, and soil used by cats. These carry germs that can cause birth defects in the baby.  Take your prenatal vitamins.  Try taking a stool softener (if your caregiver approves) if you develop constipation. Eat more high-fiber foods,  such as fresh vegetables or fruit and whole grains. Drink plenty of fluids to keep your urine clear or pale yellow.  Take warm sitz baths to soothe any pain or discomfort caused by hemorrhoids. Use hemorrhoid cream if your caregiver approves.  If you develop varicose veins, wear support hose. Elevate your feet for 15 minutes, 3 4 times a day. Limit salt in your diet.  Avoid heavy lifting, wear low heel shoes, and practice good posture.  Rest with your legs elevated if you have leg cramps or low back pain.  Visit your dentist if you have not gone yet during your pregnancy. Use a soft toothbrush to brush your teeth and be gentle when you floss.  A sexual relationship may be continued unless your caregiver directs you otherwise.  Continue to go to all your prenatal visits as directed by your caregiver. SEEK MEDICAL CARE IF:   You have dizziness.  You have mild pelvic cramps, pelvic pressure, or nagging pain in the abdominal area.  You have persistent nausea, vomiting, or diarrhea.  You have a bad smelling vaginal discharge.  You have pain with urination. SEEK IMMEDIATE MEDICAL CARE IF:   You have a fever.  You are leaking fluid from your vagina.  You have spotting or bleeding from your vagina.  You have severe abdominal cramping or pain.  You have rapid weight gain or loss.  You have shortness of breath with chest pain.  You notice sudden or extreme swelling of your face, hands, ankles, feet, or legs.  You have not felt your baby move in over an hour.  You have severe headaches that do not go away with medicine.  You have vision changes. Document Released: 08/26/2001 Document Revised: 05/04/2013 Document Reviewed: 11/02/2012 [employer] Patient Information 2014 [employer].  

## 2013-12-28 NOTE — Progress Notes (Signed)
+  FM, no lof, no vb, no ctx  Feeling well no complaints Margaret Gregory is a 23 y.o. G2P1001 at 5727w6d by R=8 presents for ROB  Discussed with Patient:  - Patient plans on breast/ bottle feeding. - Routine precautions discussed (depression, infection s/s).   Patient provided with all pertinent phone numbers for emergencies. - RTC for any VB, regular, painful cramps/ctxs occurring at a rate of >2/10 min, fever (100.5 or higher), n/v/d, any pain that is unresolving or worsening. - RTC in 4 weeks for next appt. - review US next visit Problems: Patient Active Problem List   Diagnosis Date Noted  . Short interval between pregnancies complicating pregnancy in first trimester, antepartum 11/02/2013  . Nausea and vomiting in pregnancy prior to [redacted] weeks gestation 11/02/2013    To Do: 1.  [ ]  Vaccines: Flu:dec  Tdap:  [ ]  BCM: Vasectomy  Edu: [ x] PTL precautions; [ ]  BF class; [ ]  childbirth class; [ ]   BF counseling

## 2014-01-25 ENCOUNTER — Ambulatory Visit (INDEPENDENT_AMBULATORY_CARE_PROVIDER_SITE_OTHER): Payer: Medicaid Other | Admitting: Family

## 2014-01-25 ENCOUNTER — Telehealth: Payer: Self-pay | Admitting: *Deleted

## 2014-01-25 VITALS — BP 106/73 | HR 71 | Temp 98.1°F | Wt 130.8 lb

## 2014-01-25 DIAGNOSIS — O09891 Supervision of other high risk pregnancies, first trimester: Secondary | ICD-10-CM

## 2014-01-25 DIAGNOSIS — Z3490 Encounter for supervision of normal pregnancy, unspecified, unspecified trimester: Secondary | ICD-10-CM

## 2014-01-25 DIAGNOSIS — O09899 Supervision of other high risk pregnancies, unspecified trimester: Secondary | ICD-10-CM

## 2014-01-25 DIAGNOSIS — Z3480 Encounter for supervision of other normal pregnancy, unspecified trimester: Secondary | ICD-10-CM

## 2014-01-25 LAB — POCT URINALYSIS DIP (DEVICE)
BILIRUBIN URINE: NEGATIVE
Glucose, UA: NEGATIVE mg/dL
HGB URINE DIPSTICK: NEGATIVE
Ketones, ur: NEGATIVE mg/dL
Leukocytes, UA: NEGATIVE
NITRITE: NEGATIVE
PH: 5.5 (ref 5.0–8.0)
Protein, ur: NEGATIVE mg/dL
Specific Gravity, Urine: 1.025 (ref 1.005–1.030)
UROBILINOGEN UA: 0.2 mg/dL (ref 0.0–1.0)

## 2014-01-25 NOTE — Progress Notes (Signed)
Doing well, no questions or concerns.  Reviewed lab and ultrasound results.  28 wk labs at next visit.

## 2014-01-25 NOTE — Progress Notes (Signed)
No concerns today 

## 2014-01-25 NOTE — Telephone Encounter (Signed)
Margaret Gregory left a message stating she has poison ivy- a pretty substantial case. States she is pregnant and breastfeeding and wants to know what she can use whether otc or rx.  Discussed with provider Artelia Laroche( M. Williams, CNM) and may use otc calamine lotion or corisone and/or benadryl otc.

## 2014-01-25 NOTE — Telephone Encounter (Signed)
Called Margaret Gregory and informed her she may take otc calamine lotion, otc cortisone lotion, and /or benadryl 25 mg otc. Margaret Gregory voices understanding.

## 2014-02-22 ENCOUNTER — Ambulatory Visit (INDEPENDENT_AMBULATORY_CARE_PROVIDER_SITE_OTHER): Payer: Medicaid Other | Admitting: Family

## 2014-02-22 VITALS — BP 115/80 | HR 80 | Temp 98.7°F | Wt 134.5 lb

## 2014-02-22 DIAGNOSIS — O09899 Supervision of other high risk pregnancies, unspecified trimester: Secondary | ICD-10-CM

## 2014-02-22 DIAGNOSIS — Z23 Encounter for immunization: Secondary | ICD-10-CM

## 2014-02-22 DIAGNOSIS — O09891 Supervision of other high risk pregnancies, first trimester: Secondary | ICD-10-CM

## 2014-02-22 LAB — CBC
HCT: 37.6 % (ref 36.0–46.0)
HEMOGLOBIN: 12.8 g/dL (ref 12.0–15.0)
MCH: 27.9 pg (ref 26.0–34.0)
MCHC: 34 g/dL (ref 30.0–36.0)
MCV: 82.1 fL (ref 78.0–100.0)
Platelets: 229 10*3/uL (ref 150–400)
RBC: 4.58 MIL/uL (ref 3.87–5.11)
RDW: 14.4 % (ref 11.5–15.5)
WBC: 10.5 10*3/uL (ref 4.0–10.5)

## 2014-02-22 LAB — POCT URINALYSIS DIP (DEVICE)
BILIRUBIN URINE: NEGATIVE
Glucose, UA: NEGATIVE mg/dL
HGB URINE DIPSTICK: NEGATIVE
Ketones, ur: NEGATIVE mg/dL
Leukocytes, UA: NEGATIVE
NITRITE: NEGATIVE
PH: 6.5 (ref 5.0–8.0)
Protein, ur: NEGATIVE mg/dL
Specific Gravity, Urine: 1.02 (ref 1.005–1.030)
Urobilinogen, UA: 0.2 mg/dL (ref 0.0–1.0)

## 2014-02-22 MED ORDER — TETANUS-DIPHTH-ACELL PERTUSSIS 5-2.5-18.5 LF-MCG/0.5 IM SUSP
0.5000 mL | Freq: Once | INTRAMUSCULAR | Status: DC
Start: 2014-02-22 — End: 2014-05-15

## 2014-02-22 NOTE — Progress Notes (Signed)
No questions or concerns.  1 hr today.

## 2014-02-23 LAB — RPR

## 2014-02-23 LAB — HIV ANTIBODY (ROUTINE TESTING W REFLEX): HIV 1&2 Ab, 4th Generation: NONREACTIVE

## 2014-02-23 LAB — GLUCOSE TOLERANCE, 1 HOUR (50G) W/O FASTING: Glucose, 1 Hour GTT: 126 mg/dL (ref 70–140)

## 2014-02-28 ENCOUNTER — Encounter: Payer: Self-pay | Admitting: Family

## 2014-03-08 ENCOUNTER — Ambulatory Visit (INDEPENDENT_AMBULATORY_CARE_PROVIDER_SITE_OTHER): Payer: Medicaid Other | Admitting: Advanced Practice Midwife

## 2014-03-08 ENCOUNTER — Encounter: Payer: Self-pay | Admitting: Family

## 2014-03-08 VITALS — BP 103/70 | HR 69 | Wt 138.6 lb

## 2014-03-08 DIAGNOSIS — O09891 Supervision of other high risk pregnancies, first trimester: Secondary | ICD-10-CM

## 2014-03-08 DIAGNOSIS — O09899 Supervision of other high risk pregnancies, unspecified trimester: Secondary | ICD-10-CM

## 2014-03-08 LAB — POCT URINALYSIS DIP (DEVICE)
Bilirubin Urine: NEGATIVE
Glucose, UA: NEGATIVE mg/dL
HGB URINE DIPSTICK: NEGATIVE
KETONES UR: NEGATIVE mg/dL
Nitrite: NEGATIVE
PH: 7 (ref 5.0–8.0)
PROTEIN: NEGATIVE mg/dL
SPECIFIC GRAVITY, URINE: 1.02 (ref 1.005–1.030)
Urobilinogen, UA: 0.2 mg/dL (ref 0.0–1.0)

## 2014-03-08 NOTE — Patient Instructions (Signed)
Third Trimester of Pregnancy The third trimester is from week 29 through week 42, months 7 through 9. The third trimester is a time when the fetus is growing rapidly. At the end of the ninth month, the fetus is about 20 inches in length and weighs 6-10 pounds.  BODY CHANGES Your body goes through many changes during pregnancy. The changes vary from woman to woman.   Your weight will continue to increase. You can expect to gain 25-35 pounds (11-16 kg) by the end of the pregnancy.  You may begin to get stretch marks on your hips, abdomen, and breasts.  You may urinate more often because the fetus is moving lower into your pelvis and pressing on your bladder.  You may develop or continue to have heartburn as a result of your pregnancy.  You may develop constipation because certain hormones are causing the muscles that push waste through your intestines to slow down.  You may develop hemorrhoids or swollen, bulging veins (varicose veins).  You may have pelvic pain because of the weight gain and pregnancy hormones relaxing your joints between the bones in your pelvis. Backaches may result from overexertion of the muscles supporting your posture.  You may have changes in your hair. These can include thickening of your hair, rapid growth, and changes in texture. Some women also have hair loss during or after pregnancy, or hair that feels dry or thin. Your hair will most likely return to normal after your baby is born.  Your breasts will continue to grow and be tender. A yellow discharge may leak from your breasts called colostrum.  Your belly button may stick out.  You may feel short of breath because of your expanding uterus.  You may notice the fetus "dropping," or moving lower in your abdomen.  You may have a bloody mucus discharge. This usually occurs a few days to a week before labor begins.  Your cervix becomes thin and soft (effaced) near your due date. WHAT TO EXPECT AT YOUR PRENATAL  EXAMS  You will have prenatal exams every 2 weeks until week 36. Then, you will have weekly prenatal exams. During a routine prenatal visit:  You will be weighed to make sure you and the fetus are growing normally.  Your blood pressure is taken.  Your abdomen will be measured to track your baby's growth.  The fetal heartbeat will be listened to.  Any test results from the previous visit will be discussed.  You may have a cervical check near your due date to see if you have effaced. At around 36 weeks, your caregiver will check your cervix. At the same time, your caregiver will also perform a test on the secretions of the vaginal tissue. This test is to determine if a type of bacteria, Group B streptococcus, is present. Your caregiver will explain this further. Your caregiver may ask you:  What your birth plan is.  How you are feeling.  If you are feeling the baby move.  If you have had any abnormal symptoms, such as leaking fluid, bleeding, severe headaches, or abdominal cramping.  If you have any questions. Other tests or screenings that may be performed during your third trimester include:  Blood tests that check for low iron levels (anemia).  Fetal testing to check the health, activity level, and growth of the fetus. Testing is done if you have certain medical conditions or if there are problems during the pregnancy. FALSE LABOR You may feel small, irregular contractions that   eventually go away. These are called Braxton Hicks contractions, or false labor. Contractions may last for hours, days, or even weeks before true labor sets in. If contractions come at regular intervals, intensify, or become painful, it is best to be seen by your caregiver.  SIGNS OF LABOR   Menstrual-like cramps.  Contractions that are 5 minutes apart or less.  Contractions that start on the top of the uterus and spread down to the lower abdomen and back.  A sense of increased pelvic pressure or back  pain.  A watery or bloody mucus discharge that comes from the vagina. If you have any of these signs before the 37th week of pregnancy, call your caregiver right away. You need to go to the hospital to get checked immediately. HOME CARE INSTRUCTIONS   Avoid all smoking, herbs, alcohol, and unprescribed drugs. These chemicals affect the formation and growth of the baby.  Follow your caregiver's instructions regarding medicine use. There are medicines that are either safe or unsafe to take during pregnancy.  Exercise only as directed by your caregiver. Experiencing uterine cramps is a good sign to stop exercising.  Continue to eat regular, healthy meals.  Wear a good support bra for breast tenderness.  Do not use hot tubs, steam rooms, or saunas.  Wear your seat belt at all times when driving.  Avoid raw meat, uncooked cheese, cat litter boxes, and soil used by cats. These carry germs that can cause birth defects in the baby.  Take your prenatal vitamins.  Try taking a stool softener (if your caregiver approves) if you develop constipation. Eat more high-fiber foods, such as fresh vegetables or fruit and whole grains. Drink plenty of fluids to keep your urine clear or pale yellow.  Take warm sitz baths to soothe any pain or discomfort caused by hemorrhoids. Use hemorrhoid cream if your caregiver approves.  If you develop varicose veins, wear support hose. Elevate your feet for 15 minutes, 3-4 times a day. Limit salt in your diet.  Avoid heavy lifting, wear low heal shoes, and practice good posture.  Rest a lot with your legs elevated if you have leg cramps or low back pain.  Visit your dentist if you have not gone during your pregnancy. Use a soft toothbrush to brush your teeth and be gentle when you floss.  A sexual relationship may be continued unless your caregiver directs you otherwise.  Do not travel far distances unless it is absolutely necessary and only with the approval  of your caregiver.  Take prenatal classes to understand, practice, and ask questions about the labor and delivery.  Make a trial run to the hospital.  Pack your hospital bag.  Prepare the baby's nursery.  Continue to go to all your prenatal visits as directed by your caregiver. SEEK MEDICAL CARE IF:  You are unsure if you are in labor or if your water has broken.  You have dizziness.  You have mild pelvic cramps, pelvic pressure, or nagging pain in your abdominal area.  You have persistent nausea, vomiting, or diarrhea.  You have a bad smelling vaginal discharge.  You have pain with urination. SEEK IMMEDIATE MEDICAL CARE IF:   You have a fever.  You are leaking fluid from your vagina.  You have spotting or bleeding from your vagina.  You have severe abdominal cramping or pain.  You have rapid weight loss or gain.  You have shortness of breath with chest pain.  You notice sudden or extreme swelling   of your face, hands, ankles, feet, or legs.  You have not felt your baby move in over an hour.  You have severe headaches that do not go away with medicine.  You have vision changes. Document Released: 08/26/2001 Document Revised: 09/06/2013 Document Reviewed: 11/02/2012 Exit[employer] Patient Information 2015 [employer]. This information is not intended to replace advice given to you by your health care provider. Make sure you discuss any questions you have with your health care provider.  

## 2014-03-08 NOTE — Progress Notes (Signed)
Reviewed 28 week labs.

## 2014-03-22 ENCOUNTER — Ambulatory Visit (INDEPENDENT_AMBULATORY_CARE_PROVIDER_SITE_OTHER): Payer: Medicaid Other | Admitting: Family

## 2014-03-22 VITALS — BP 96/65 | HR 70 | Wt 139.7 lb

## 2014-03-22 DIAGNOSIS — Z3493 Encounter for supervision of normal pregnancy, unspecified, third trimester: Secondary | ICD-10-CM

## 2014-03-22 DIAGNOSIS — Z3480 Encounter for supervision of other normal pregnancy, unspecified trimester: Secondary | ICD-10-CM

## 2014-03-22 LAB — POCT URINALYSIS DIP (DEVICE)
Bilirubin Urine: NEGATIVE
Glucose, UA: NEGATIVE mg/dL
HGB URINE DIPSTICK: NEGATIVE
Ketones, ur: NEGATIVE mg/dL
Nitrite: NEGATIVE
PH: 7 (ref 5.0–8.0)
PROTEIN: NEGATIVE mg/dL
SPECIFIC GRAVITY, URINE: 1.02 (ref 1.005–1.030)
UROBILINOGEN UA: 0.2 mg/dL (ref 0.0–1.0)

## 2014-03-22 NOTE — Progress Notes (Signed)
No questions or concerns.  Discussed last birth, husband helped catch with my assistance.  Hoping for a similar experience.

## 2014-04-04 ENCOUNTER — Ambulatory Visit (INDEPENDENT_AMBULATORY_CARE_PROVIDER_SITE_OTHER): Payer: Medicaid Other | Admitting: Advanced Practice Midwife

## 2014-04-04 VITALS — BP 107/62 | HR 77 | Temp 98.3°F | Wt 141.9 lb

## 2014-04-04 DIAGNOSIS — O09899 Supervision of other high risk pregnancies, unspecified trimester: Secondary | ICD-10-CM

## 2014-04-04 DIAGNOSIS — O09891 Supervision of other high risk pregnancies, first trimester: Secondary | ICD-10-CM

## 2014-04-04 LAB — POCT URINALYSIS DIP (DEVICE)
Bilirubin Urine: NEGATIVE
Glucose, UA: NEGATIVE mg/dL
HGB URINE DIPSTICK: NEGATIVE
Ketones, ur: NEGATIVE mg/dL
Leukocytes, UA: NEGATIVE
NITRITE: NEGATIVE
PH: 7 (ref 5.0–8.0)
PROTEIN: NEGATIVE mg/dL
Specific Gravity, Urine: 1.015 (ref 1.005–1.030)
UROBILINOGEN UA: 0.2 mg/dL (ref 0.0–1.0)

## 2014-04-04 NOTE — Progress Notes (Signed)
Fetal Echo scheduled with Dr. Elizebeth Brookingotton on August 11th @ 0830

## 2014-04-04 NOTE — Progress Notes (Signed)
Doing well.  Good fetal movement, denies vaginal bleeding, LOF, regular contractions.  First baby with bicuspid aortic valve with murmur, no complications.  Fetal echo ordered.

## 2014-04-05 NOTE — Addendum Note (Signed)
Addended by: Sharen CounterLEFTWICH-KIRBY, Angelena Sand A on: 04/05/2014 09:00 PM   Modules accepted: Level of Service

## 2014-04-19 ENCOUNTER — Encounter: Payer: Self-pay | Admitting: Obstetrics and Gynecology

## 2014-04-19 ENCOUNTER — Ambulatory Visit (INDEPENDENT_AMBULATORY_CARE_PROVIDER_SITE_OTHER): Payer: Medicaid Other | Admitting: Obstetrics and Gynecology

## 2014-04-19 ENCOUNTER — Other Ambulatory Visit: Payer: Self-pay | Admitting: Obstetrics and Gynecology

## 2014-04-19 VITALS — BP 105/71 | HR 99 | Wt 141.2 lb

## 2014-04-19 DIAGNOSIS — Z3480 Encounter for supervision of other normal pregnancy, unspecified trimester: Secondary | ICD-10-CM

## 2014-04-19 DIAGNOSIS — O09891 Supervision of other high risk pregnancies, first trimester: Secondary | ICD-10-CM

## 2014-04-19 DIAGNOSIS — Z3493 Encounter for supervision of normal pregnancy, unspecified, third trimester: Secondary | ICD-10-CM

## 2014-04-19 DIAGNOSIS — O09899 Supervision of other high risk pregnancies, unspecified trimester: Secondary | ICD-10-CM

## 2014-04-19 LAB — POCT URINALYSIS DIP (DEVICE)
Glucose, UA: NEGATIVE mg/dL
HGB URINE DIPSTICK: NEGATIVE
Ketones, ur: NEGATIVE mg/dL
NITRITE: NEGATIVE
PH: 5.5 (ref 5.0–8.0)
PROTEIN: NEGATIVE mg/dL
Specific Gravity, Urine: 1.02 (ref 1.005–1.030)
Urobilinogen, UA: 0.2 mg/dL (ref 0.0–1.0)

## 2014-04-19 NOTE — Addendum Note (Signed)
Addended by: Candelaria StagersHAIZLIP, Kismet Facemire E on: 04/19/2014 12:54 PM   Modules accepted: Orders

## 2014-04-19 NOTE — Patient Instructions (Signed)
Third Trimester of Pregnancy The third trimester is from week 29 through week 42, months 7 through 9. The third trimester is a time when the fetus is growing rapidly. At the end of the ninth month, the fetus is about 20 inches in length and weighs 6-10 pounds.  BODY CHANGES Your body goes through many changes during pregnancy. The changes vary from woman to woman.   Your weight will continue to increase. You can expect to gain 25-35 pounds (11-16 kg) by the end of the pregnancy.  You may begin to get stretch marks on your hips, abdomen, and breasts.  You may urinate more often because the fetus is moving lower into your pelvis and pressing on your bladder.  You may develop or continue to have heartburn as a result of your pregnancy.  You may develop constipation because certain hormones are causing the muscles that push waste through your intestines to slow down.  You may develop hemorrhoids or swollen, bulging veins (varicose veins).  You may have pelvic pain because of the weight gain and pregnancy hormones relaxing your joints between the bones in your pelvis. Backaches may result from overexertion of the muscles supporting your posture.  You may have changes in your hair. These can include thickening of your hair, rapid growth, and changes in texture. Some women also have hair loss during or after pregnancy, or hair that feels dry or thin. Your hair will most likely return to normal after your baby is born.  Your breasts will continue to grow and be tender. A yellow discharge may leak from your breasts called colostrum.  Your belly button may stick out.  You may feel short of breath because of your expanding uterus.  You may notice the fetus "dropping," or moving lower in your abdomen.  You may have a bloody mucus discharge. This usually occurs a few days to a week before labor begins.  Your cervix becomes thin and soft (effaced) near your due date. WHAT TO EXPECT AT YOUR PRENATAL  EXAMS  You will have prenatal exams every 2 weeks until week 36. Then, you will have weekly prenatal exams. During a routine prenatal visit:  You will be weighed to make sure you and the fetus are growing normally.  Your blood pressure is taken.  Your abdomen will be measured to track your baby's growth.  The fetal heartbeat will be listened to.  Any test results from the previous visit will be discussed.  You may have a cervical check near your due date to see if you have effaced. At around 36 weeks, your caregiver will check your cervix. At the same time, your caregiver will also perform a test on the secretions of the vaginal tissue. This test is to determine if a type of bacteria, Group B streptococcus, is present. Your caregiver will explain this further. Your caregiver may ask you:  What your birth plan is.  How you are feeling.  If you are feeling the baby move.  If you have had any abnormal symptoms, such as leaking fluid, bleeding, severe headaches, or abdominal cramping.  If you have any questions. Other tests or screenings that may be performed during your third trimester include:  Blood tests that check for low iron levels (anemia).  Fetal testing to check the health, activity level, and growth of the fetus. Testing is done if you have certain medical conditions or if there are problems during the pregnancy. FALSE LABOR You may feel small, irregular contractions that   eventually go away. These are called Braxton Hicks contractions, or false labor. Contractions may last for hours, days, or even weeks before true labor sets in. If contractions come at regular intervals, intensify, or become painful, it is best to be seen by your caregiver.  SIGNS OF LABOR   Menstrual-like cramps.  Contractions that are 5 minutes apart or less.  Contractions that start on the top of the uterus and spread down to the lower abdomen and back.  A sense of increased pelvic pressure or back  pain.  A watery or bloody mucus discharge that comes from the vagina. If you have any of these signs before the 37th week of pregnancy, call your caregiver right away. You need to go to the hospital to get checked immediately. HOME CARE INSTRUCTIONS   Avoid all smoking, herbs, alcohol, and unprescribed drugs. These chemicals affect the formation and growth of the baby.  Follow your caregiver's instructions regarding medicine use. There are medicines that are either safe or unsafe to take during pregnancy.  Exercise only as directed by your caregiver. Experiencing uterine cramps is a good sign to stop exercising.  Continue to eat regular, healthy meals.  Wear a good support bra for breast tenderness.  Do not use hot tubs, steam rooms, or saunas.  Wear your seat belt at all times when driving.  Avoid raw meat, uncooked cheese, cat litter boxes, and soil used by cats. These carry germs that can cause birth defects in the baby.  Take your prenatal vitamins.  Try taking a stool softener (if your caregiver approves) if you develop constipation. Eat more high-fiber foods, such as fresh vegetables or fruit and whole grains. Drink plenty of fluids to keep your urine clear or pale yellow.  Take warm sitz baths to soothe any pain or discomfort caused by hemorrhoids. Use hemorrhoid cream if your caregiver approves.  If you develop varicose veins, wear support hose. Elevate your feet for 15 minutes, 3-4 times a day. Limit salt in your diet.  Avoid heavy lifting, wear low heal shoes, and practice good posture.  Rest a lot with your legs elevated if you have leg cramps or low back pain.  Visit your dentist if you have not gone during your pregnancy. Use a soft toothbrush to brush your teeth and be gentle when you floss.  A sexual relationship may be continued unless your caregiver directs you otherwise.  Do not travel far distances unless it is absolutely necessary and only with the approval  of your caregiver.  Take prenatal classes to understand, practice, and ask questions about the labor and delivery.  Make a trial run to the hospital.  Pack your hospital bag.  Prepare the baby's nursery.  Continue to go to all your prenatal visits as directed by your caregiver. SEEK MEDICAL CARE IF:  You are unsure if you are in labor or if your water has broken.  You have dizziness.  You have mild pelvic cramps, pelvic pressure, or nagging pain in your abdominal area.  You have persistent nausea, vomiting, or diarrhea.  You have a bad smelling vaginal discharge.  You have pain with urination. SEEK IMMEDIATE MEDICAL CARE IF:   You have a fever.  You are leaking fluid from your vagina.  You have spotting or bleeding from your vagina.  You have severe abdominal cramping or pain.  You have rapid weight loss or gain.  You have shortness of breath with chest pain.  You notice sudden or extreme swelling   of your face, hands, ankles, feet, or legs.  You have not felt your baby move in over an hour.  You have severe headaches that do not go away with medicine.  You have vision changes. Document Released: 08/26/2001 Document Revised: 09/06/2013 Document Reviewed: 11/02/2012 Exit[employer] Patient Information 2015 [employer]. This information is not intended to replace advice given to you by your health care provider. Make sure you discuss any questions you have with your health care provider.  

## 2014-04-19 NOTE — Progress Notes (Signed)
Doing well. Has weaned her3 yo. Cultures done.EFW 5+. Has fetal echo scheduled.  Reviewed plans.

## 2014-04-20 LAB — GC/CHLAMYDIA PROBE AMP
CT Probe RNA: NEGATIVE
GC Probe RNA: NEGATIVE

## 2014-04-22 LAB — CULTURE, BETA STREP (GROUP B ONLY)

## 2014-05-04 ENCOUNTER — Encounter: Payer: Self-pay | Admitting: *Deleted

## 2014-05-04 NOTE — Progress Notes (Signed)
FMLA paperwork for spouse completed.

## 2014-05-08 ENCOUNTER — Ambulatory Visit (INDEPENDENT_AMBULATORY_CARE_PROVIDER_SITE_OTHER): Payer: Medicaid Other | Admitting: Obstetrics and Gynecology

## 2014-05-08 VITALS — BP 111/71 | HR 68 | Temp 98.1°F | Wt 145.7 lb

## 2014-05-08 DIAGNOSIS — O09899 Supervision of other high risk pregnancies, unspecified trimester: Secondary | ICD-10-CM

## 2014-05-08 DIAGNOSIS — O09891 Supervision of other high risk pregnancies, first trimester: Secondary | ICD-10-CM

## 2014-05-08 LAB — POCT URINALYSIS DIP (DEVICE)
BILIRUBIN URINE: NEGATIVE
GLUCOSE, UA: NEGATIVE mg/dL
Hgb urine dipstick: NEGATIVE
Ketones, ur: NEGATIVE mg/dL
Leukocytes, UA: NEGATIVE
Nitrite: NEGATIVE
Protein, ur: NEGATIVE mg/dL
Specific Gravity, Urine: 1.02 (ref 1.005–1.030)
Urobilinogen, UA: 0.2 mg/dL (ref 0.0–1.0)
pH: 6.5 (ref 5.0–8.0)

## 2014-05-08 NOTE — Progress Notes (Signed)
Reports occasional pelvic pressure and intermittent mild contractions.

## 2014-05-08 NOTE — Progress Notes (Signed)
Doing well today. No concerns or complaints. Had less than an hour of uterine contractions a few days ago. None since. Counseled on labor precautions.

## 2014-05-08 NOTE — Patient Instructions (Signed)
Follow-up one week.

## 2014-05-10 ENCOUNTER — Encounter: Payer: Self-pay | Admitting: *Deleted

## 2014-05-14 ENCOUNTER — Encounter (HOSPITAL_COMMUNITY): Payer: Medicaid Other | Admitting: Anesthesiology

## 2014-05-14 ENCOUNTER — Inpatient Hospital Stay (HOSPITAL_COMMUNITY): Payer: Medicaid Other | Admitting: Anesthesiology

## 2014-05-14 ENCOUNTER — Inpatient Hospital Stay (HOSPITAL_COMMUNITY)
Admission: AD | Admit: 2014-05-14 | Discharge: 2014-05-15 | DRG: 775 | Disposition: A | Discharge status: Home / Self Care | Payer: Medicaid Other | Source: Ambulatory Visit | Attending: Obstetrics & Gynecology | Admitting: Obstetrics & Gynecology

## 2014-05-14 ENCOUNTER — Encounter (HOSPITAL_COMMUNITY): Payer: Self-pay | Admitting: *Deleted

## 2014-05-14 DIAGNOSIS — O479 False labor, unspecified: Secondary | ICD-10-CM | POA: Diagnosis present

## 2014-05-14 DIAGNOSIS — IMO0001 Reserved for inherently not codable concepts without codable children: Secondary | ICD-10-CM

## 2014-05-14 LAB — CBC
HCT: 36.6 % (ref 36.0–46.0)
Hemoglobin: 13.1 g/dL (ref 12.0–15.0)
MCH: 29.7 pg (ref 26.0–34.0)
MCHC: 35.8 g/dL (ref 30.0–36.0)
MCV: 83 fL (ref 78.0–100.0)
PLATELETS: 192 10*3/uL (ref 150–400)
RBC: 4.41 MIL/uL (ref 3.87–5.11)
RDW: 13.2 % (ref 11.5–15.5)
WBC: 11.6 10*3/uL — AB (ref 4.0–10.5)

## 2014-05-14 LAB — RPR

## 2014-05-14 MED ORDER — ONDANSETRON HCL 4 MG/2ML IJ SOLN: 4.0000 mg | INTRAMUSCULAR | Status: DC | PRN

## 2014-05-14 MED ORDER — LANOLIN HYDROUS EX OINT: TOPICAL_OINTMENT | CUTANEOUS | Status: DC | PRN

## 2014-05-14 MED ORDER — OXYCODONE-ACETAMINOPHEN 5-325 MG PO TABS
1.0000 | ORAL_TABLET | ORAL | Status: DC | PRN
  Administered 2014-05-15: 1 via ORAL
  Filled 2014-05-14: qty 1

## 2014-05-14 MED ORDER — DIPHENHYDRAMINE HCL 25 MG PO CAPS: 25.0000 mg | ORAL_CAPSULE | Freq: Four times a day (QID) | ORAL | Status: DC | PRN

## 2014-05-14 MED ORDER — CITRIC ACID-SODIUM CITRATE 334-500 MG/5ML PO SOLN: 30.0000 mL | ORAL | Status: DC | PRN

## 2014-05-14 MED ORDER — SENNOSIDES-DOCUSATE SODIUM 8.6-50 MG PO TABS
2.0000 | ORAL_TABLET | ORAL | Status: DC
  Filled 2014-05-14: qty 2

## 2014-05-14 MED ORDER — IBUPROFEN 600 MG PO TABS
600.0000 mg | ORAL_TABLET | Freq: Four times a day (QID) | ORAL | Status: DC
Start: 2014-05-14 — End: 2014-05-16
  Administered 2014-05-14 – 2014-05-15 (×4): 600 mg via ORAL
  Filled 2014-05-14 (×4): qty 1

## 2014-05-14 MED ORDER — SIMETHICONE 80 MG PO CHEW: 80.0000 mg | CHEWABLE_TABLET | ORAL | Status: DC | PRN

## 2014-05-14 MED ORDER — ONDANSETRON HCL 4 MG/2ML IJ SOLN: 4.0000 mg | Freq: Four times a day (QID) | INTRAMUSCULAR | Status: DC | PRN

## 2014-05-14 MED ORDER — ONDANSETRON HCL 4 MG PO TABS: 4.0000 mg | ORAL_TABLET | ORAL | Status: DC | PRN

## 2014-05-14 MED ORDER — PRENATAL MULTIVITAMIN CH
1.0000 | ORAL_TABLET | Freq: Every day | ORAL | Status: DC
  Administered 2014-05-15: 1 via ORAL
  Filled 2014-05-14: qty 1

## 2014-05-14 MED ORDER — FENTANYL CITRATE 0.05 MG/ML IJ SOLN: 100.0000 ug | INTRAMUSCULAR | Status: DC | PRN

## 2014-05-14 MED ORDER — WITCH HAZEL-GLYCERIN EX PADS: 1.0000 "application " | MEDICATED_PAD | CUTANEOUS | Status: DC | PRN

## 2014-05-14 MED ORDER — EPHEDRINE 5 MG/ML INJ
10.0000 mg | INTRAVENOUS | Status: DC | PRN
  Filled 2014-05-14: qty 2

## 2014-05-14 MED ORDER — PHENYLEPHRINE 40 MCG/ML (10ML) SYRINGE FOR IV PUSH (FOR BLOOD PRESSURE SUPPORT)
80.0000 ug | PREFILLED_SYRINGE | INTRAVENOUS | Status: DC | PRN
  Filled 2014-05-14: qty 2

## 2014-05-14 MED ORDER — PHENYLEPHRINE 40 MCG/ML (10ML) SYRINGE FOR IV PUSH (FOR BLOOD PRESSURE SUPPORT)
80.0000 ug | PREFILLED_SYRINGE | INTRAVENOUS | Status: DC | PRN
  Filled 2014-05-14: qty 2
  Filled 2014-05-14: qty 10

## 2014-05-14 MED ORDER — OXYTOCIN BOLUS FROM INFUSION
500.0000 mL | INTRAVENOUS | Status: DC
  Administered 2014-05-14: 500 mL via INTRAVENOUS

## 2014-05-14 MED ORDER — OXYTOCIN 40 UNITS IN LACTATED RINGERS INFUSION - SIMPLE MED
62.5000 mL/h | INTRAVENOUS | Status: DC
  Filled 2014-05-14: qty 1000

## 2014-05-14 MED ORDER — SODIUM CHLORIDE 0.9 % IV SOLN: 250.0000 mL | INTRAVENOUS | Status: DC | PRN

## 2014-05-14 MED ORDER — FENTANYL 2.5 MCG/ML BUPIVACAINE 1/10 % EPIDURAL INFUSION (WH - ANES)
14.0000 mL/h | INTRAMUSCULAR | Status: DC | PRN
  Filled 2014-05-14: qty 125

## 2014-05-14 MED ORDER — ACETAMINOPHEN 325 MG PO TABS: 650.0000 mg | ORAL_TABLET | ORAL | Status: DC | PRN

## 2014-05-14 MED ORDER — DIPHENHYDRAMINE HCL 50 MG/ML IJ SOLN: 12.5000 mg | INTRAMUSCULAR | Status: DC | PRN

## 2014-05-14 MED ORDER — LACTATED RINGERS IV SOLN
500.0000 mL | Freq: Once | INTRAVENOUS | Status: AC
  Administered 2014-05-14: 500 mL via INTRAVENOUS

## 2014-05-14 MED ORDER — LACTATED RINGERS IV SOLN: 500.0000 mL | INTRAVENOUS | Status: DC | PRN

## 2014-05-14 MED ORDER — LIDOCAINE HCL (PF) 1 % IJ SOLN
30.0000 mL | INTRAMUSCULAR | Status: DC | PRN
  Filled 2014-05-14: qty 30

## 2014-05-14 MED ORDER — DIBUCAINE 1 % RE OINT: 1.0000 "application " | TOPICAL_OINTMENT | RECTAL | Status: DC | PRN

## 2014-05-14 MED ORDER — SODIUM CHLORIDE 0.9 % IJ SOLN: 3.0000 mL | Freq: Two times a day (BID) | INTRAMUSCULAR | Status: DC

## 2014-05-14 MED ORDER — LACTATED RINGERS IV SOLN
INTRAVENOUS | Status: DC
  Administered 2014-05-14: 16:00:00 via INTRAVENOUS

## 2014-05-14 MED ORDER — LIDOCAINE HCL (PF) 1 % IJ SOLN
INTRAMUSCULAR | Status: DC | PRN
  Administered 2014-05-14 (×2): 4 mL

## 2014-05-14 MED ORDER — ZOLPIDEM TARTRATE 5 MG PO TABS: 5.0000 mg | ORAL_TABLET | Freq: Every evening | ORAL | Status: DC | PRN

## 2014-05-14 MED ORDER — OXYCODONE-ACETAMINOPHEN 5-325 MG PO TABS: 1.0000 | ORAL_TABLET | ORAL | Status: DC | PRN

## 2014-05-14 MED ORDER — BENZOCAINE-MENTHOL 20-0.5 % EX AERO: 1.0000 "application " | INHALATION_SPRAY | CUTANEOUS | Status: DC | PRN

## 2014-05-14 MED ORDER — MISOPROSTOL 200 MCG PO TABS
ORAL_TABLET | ORAL | Status: AC
  Administered 2014-05-14: 800 ug
  Filled 2014-05-14: qty 4

## 2014-05-14 MED ORDER — FENTANYL 2.5 MCG/ML BUPIVACAINE 1/10 % EPIDURAL INFUSION (WH - ANES)
INTRAMUSCULAR | Status: DC | PRN
  Administered 2014-05-14: 14 mL/h via EPIDURAL

## 2014-05-14 MED ORDER — FENTANYL 2.5 MCG/ML BUPIVACAINE 1/10 % EPIDURAL INFUSION (WH - ANES): 12.0000 mL/h | INTRAMUSCULAR | Status: DC | PRN

## 2014-05-14 MED ORDER — SODIUM CHLORIDE 0.9 % IJ SOLN: 3.0000 mL | INTRAMUSCULAR | Status: DC | PRN

## 2014-05-14 MED ORDER — IBUPROFEN 600 MG PO TABS
600.0000 mg | ORAL_TABLET | Freq: Four times a day (QID) | ORAL | Status: DC | PRN
Start: 2014-05-14 — End: 2014-05-14

## 2014-05-14 NOTE — H&P (Signed)
Margaret Gregory is a 23 y.o. female G66P1001  midwife only pt of [facility] presenting for labor evaluation with intact membranes.  She reports good fetal movement, denies LOF, vaginal bleeding, vaginal itching/burning, urinary symptoms, h/a, dizziness, n/v, or fever/chills.     Clinic   [facility]--MIDWIFE only  Dating  R=8wk [place]  Genetic Screen  Declined  Anatomic [place]  normal  GTT  Third trimester: 126  TDaP vaccine  02/22/14  Flu vaccine  Declined  GBS  neg  Contraception  vasectomy  [employer]  breast  Circumcision  Female   Pediatrician  [facility]  Support Person  Husband    Maternal Medical History:  Reason for admission: Contractions.  Nausea.  Contractions: Onset was 3-5 hours ago.   Frequency: regular.   Duration is approximately 1 minute.   Perceived severity is strong.    Fetal activity: Perceived fetal activity is normal.   Last perceived fetal movement was within the past hour.    Prenatal complications: no prenatal complications Prenatal Complications - Diabetes: none.    OB History   Grav Para Term Preterm Abortions TAB SAB Ect Mult Living   0 0 0 0 0 0 1     Past Medical History  Diagnosis Date  . Chronic kidney disease     caused by apples and hot dogs   Past Surgical History  Procedure Laterality Date  . Cholecystectomy N/A 04/12/2013    Procedure: LAPAROSCOPIC CHOLECYSTECTOMY WITH INTRAOPERATIVE CHOLANGIOGRAM;  Surgeon: Ernestene Mention, MD;  Location: [facility] ORS;  Service: General;  Laterality: N/A;  . Eus N/A 04/13/2013    Procedure: ESOPHAGEAL ENDOSCOPIC ULTRASOUND (EUS) RADIAL;  Surgeon: Theda Belfast, MD;  Location: [facility] ENDOSCOPY;  Service: Endoscopy;  Laterality: N/A;   Family History: family history includes Cancer in her maternal grandmother and maternal uncle. Social History:  reports that she has never smoked. She has never used smokeless tobacco. She reports that she does not drink alcohol or use illicit drugs.   Prenatal Transfer Tool   Maternal Diabetes: No Genetic Screening: Declined Maternal Ultrasounds/Referrals: Normal Fetal Ultrasounds or other Referrals:  None Maternal Substance Abuse:  No Significant Maternal Medications:  None Significant Maternal Lab Results:  Lab values include: Group B Strep negative Other Comments:  None  Review of Systems  Constitutional: Negative for fever, chills and malaise/fatigue.  Eyes: Negative for blurred vision.  Respiratory: Negative for cough and shortness of breath.   Cardiovascular: Negative for chest pain.  Gastrointestinal: Positive for abdominal pain. Negative for heartburn, nausea and vomiting.  Genitourinary: Negative for dysuria, urgency and frequency.  Musculoskeletal: Negative.   Neurological: Negative for dizziness and headaches.  Psychiatric/Behavioral: Negative for depression.    Dilation: 5 Effacement (%): 80 Station: -2 Exam by:: Ginger Morris RN Blood pressure 121/67, pulse 51, temperature 98.2 F (36.8 C), temperature source Oral, resp. rate 16, currently breastfeeding. Maternal Exam:  Uterine Assessment: Contraction strength is moderate.  Contraction duration is 80 seconds. Contraction frequency is regular.   Abdomen: Patient reports no abdominal tenderness. Fetal presentation: vertex  Cervix: Cervix evaluated by digital exam.     Fetal Exam Fetal Monitor Review: Mode: ultrasound.   Baseline rate: 125.  Variability: moderate (6-25 bpm).   Pattern: accelerations present and no decelerations.    Fetal State Assessment: Category I - tracings are normal.     Physical Exam  Nursing note and vitals reviewed. Constitutional: She is oriented to person, place, and time. She appears well-developed and well-nourished.  Neck: Normal range of motion.  Cardiovascular: Normal rate and regular rhythm.   Respiratory: Effort normal and breath sounds normal.  GI: Soft.  Musculoskeletal: Normal range of motion.  Neurological: She is alert and oriented to  person, place, and time.  Skin: Skin is warm and dry.  Psychiatric: She has a normal mood and affect. Her behavior is normal. Judgment and thought content normal.   BP 121/67  Pulse 51  Temp(Src) 98.2 F (36.8 C) (Oral)  Resp 16  Prenatal labs: ABO, Rh: O/POS/-- (01/16 1119) Antibody: NEG (01/16 1119) Rubella: 2.44 (01/16 1119) RPR: NON REAC (06/10 0932)  HBsAg: NEGATIVE (01/16 1119)  HIV: NONREACTIVE (06/10 0932)  GBS:   Negative Third trimester glucose screen: 126  Assessment/Plan: G2P1001  admitted for active labor at term GBS negative Midwife only pt  Admit to birthing suites Expectant management Anticipate NSVD   LEFTWICH-KIRBY, LISA 05/14/2014, 11:28 AM

## 2014-05-14 NOTE — Anesthesia Preprocedure Evaluation (Signed)
Anesthesia Evaluation  Patient identified by MRN, date of birth, ID band Patient awake    Reviewed: Allergy & Precautions, H&P , NPO status , Patient's Chart, lab work & pertinent test results  Airway Mallampati: II TM Distance: >3 FB Neck ROM: full    Dental no notable dental hx. (+) Teeth Intact, Dental Advisory Given   Pulmonary neg pulmonary ROS,  breath sounds clear to auscultation  Pulmonary exam normal       Cardiovascular Exercise Tolerance: Good negative cardio ROS  Rhythm:regular Rate:Normal     Neuro/Psych negative neurological ROS  negative psych ROS   GI/Hepatic negative GI ROS, Neg liver ROS,   Endo/Other  negative endocrine ROS  Renal/GU negative Renal ROS  negative genitourinary   Musculoskeletal   Abdominal   Peds  Hematology negative hematology ROS (+)   Anesthesia Other Findings   Reproductive/Obstetrics negative OB ROS                           Anesthesia Physical  Anesthesia Plan  ASA: II  Anesthesia Plan: Epidural   Post-op Pain Management:    Induction:   Airway Management Planned:   Additional Equipment:   Intra-op Plan:   Post-operative Plan:   Informed Consent: I have reviewed the patients History and Physical, chart, labs and discussed the procedure including the risks, benefits and alternatives for the proposed anesthesia with the patient or authorized representative who has indicated his/her understanding and acceptance.     Plan Discussed with:   Anesthesia Plan Comments:         Anesthesia Quick Evaluation

## 2014-05-14 NOTE — Progress Notes (Signed)
Margaret Gregory is a 23 y.o. G2P1001 at [redacted]w[redacted]d admitted for active labor  Subjective: Pt comfortable with epidural.  Family in room for support. 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    Objective: BP 97/62  Pulse 73  Temp(Src) 98.2 F (36.8 C) (Oral)  Resp 16  Ht  (1.626 m)  Wt 66.089 kg (145 lb 11.2 oz)  BMI 25.00 kg/m2  SpO2 99%      FHT:  FHR: 135 bpm, variability: moderate,  accelerations:  Present,  decelerations:  Absent UC:   regular, every 5 minutes SVE:   Dilation: 6 Effacement (%): 80 Station: -2 Exam by:: Sharen Counter CNM  Labs: Lab Results  Component Value Date   WBC 11.6* 05/14/2014   HGB 13.1 05/14/2014   HCT 36.6 05/14/2014   MCV 83.0 05/14/2014   PLT 192 05/14/2014    Assessment / Plan: Spontaneous labor, progressing normally  Labor: Progressing normally Preeclampsia:  n/a Fetal Wellbeing:  Category I Pain Control:  Epidural I/D:  n/a Anticipated MOD:  NSVD  LEFTWICH-KIRBY, Cris Talavera 05/14/2014, 2:35 PM

## 2014-05-14 NOTE — Progress Notes (Signed)
Report received, care assumed.

## 2014-05-14 NOTE — Anesthesia Procedure Notes (Addendum)
Epidural Patient location during procedure: OB  Staffing Anesthesiologist: Suheily Birks R Performed by: anesthesiologist   Preanesthetic Checklist Completed: patient identified, pre-op evaluation, timeout performed, IV checked, risks and benefits discussed and monitors and equipment checked  Epidural Patient position: sitting Prep: site prepped and draped and DuraPrep Patient monitoring: heart rate Approach: midline Location: L3-L4 Injection technique: LOR air and LOR saline  Needle:  Needle type: Tuohy  Needle gauge: 17 G Needle length: 9 cm Needle insertion depth: 5 cm Catheter type: closed end flexible Catheter size: 19 Gauge Catheter at skin depth: 11 cm Test dose: negative  Assessment Sensory level: T8 Events: blood not aspirated, injection not painful, no injection resistance, negative IV test and no paresthesia  Additional Notes Reason for block:procedure for pain   

## 2014-05-14 NOTE — MAU Note (Signed)
Pt presents to  MAU with complaints of contractions since 3am. Denies any LOF reports some bloody show

## 2014-05-15 ENCOUNTER — Encounter: Payer: Medicaid Other | Admitting: Obstetrics and Gynecology

## 2014-05-15 LAB — CBC
HCT: 31.9 % — ABNORMAL LOW (ref 36.0–46.0)
Hemoglobin: 10.8 g/dL — ABNORMAL LOW (ref 12.0–15.0)
MCH: 28.5 pg (ref 26.0–34.0)
MCHC: 33.9 g/dL (ref 30.0–36.0)
MCV: 84.2 fL (ref 78.0–100.0)
PLATELETS: 167 10*3/uL (ref 150–400)
RBC: 3.79 MIL/uL — AB (ref 3.87–5.11)
RDW: 13.2 % (ref 11.5–15.5)
WBC: 11 10*3/uL — ABNORMAL HIGH (ref 4.0–10.5)

## 2014-05-15 MED ORDER — DOCUSATE SODIUM 100 MG PO CAPS: 100.0000 mg | ORAL_CAPSULE | Freq: Two times a day (BID) | ORAL | Status: AC

## 2014-05-15 MED ORDER — IBUPROFEN 600 MG PO TABS: 600.0000 mg | ORAL_TABLET | Freq: Four times a day (QID) | ORAL | Status: AC

## 2014-05-15 NOTE — Lactation Note (Signed)
This note was copied from the chart of [name]. Lactation Consultation Note  Patient Name: Margaret Gregory [id number] Date: 05/15/2014 Reason for consult: Initial assessment Mom reports baby is BF well, denies questions or concerns. Experienced BF. Lactation brochure left for review, advised of OP services and support group. Encouraged to call for questions/concerns or if would like assist.   Maternal Data Formula Feeding for Exclusion: No Has patient been taught Hand Expression?: No (Mom experienced BF, she reports she knows how to hand express) Does the patient have breastfeeding experience prior to this delivery?: Yes  Feeding    LATCH Score/Interventions                      Lactation Tools Discussed/Used     Consult Status Consult Status: Follow-up Date: 05/16/14 Follow-up type: In-patient    Alfred Levins 05/15/2014, 2:15 PM

## 2014-05-15 NOTE — Addendum Note (Signed)
Addendum created 05/15/14 0804 by Algis Greenhouse, CRNA   Modules edited: Charges VN, Notes Section   Notes Section:  File: [id number]

## 2014-05-15 NOTE — Discharge Instructions (Signed)

## 2014-05-15 NOTE — Discharge Summary (Signed)
Obstetric Discharge Summary Reason for Admission: onset of labor Prenatal Procedures: none Intrapartum Procedures: spontaneous vaginal delivery Postpartum Procedures: none Complications-Operative and Postpartum: 1st degree Right and left labial, hemostatic, not repaired Hemoglobin  Date Value Ref Range Status  05/15/2014 10.8* 12.0 - 15.0 g/dL Final     DELTA CHECK NOTED     REPEATED TO VERIFY  12/06/2012 12.0   Final     HCT  Date Value Ref Range Status  05/15/2014 31.9* 36.0 - 46.0 % Final  12/06/2012 36   Final    Discharge Diagnoses: Term Pregnancy-delivered  Hospital Course:  Margaret Gregory is a 23 y.o. [id number] who presented with SOL.  She had a uncomplicated SVD with 1st degree Right and left labial, hemostatic, not repaired. She was able to ambulate, tolerate PO and void normally. She was discharged home with instructions for postpartum care.    Delivery Note  At 6:53 PM a viable and healthy female was delivered via Vaginal, Spontaneous Delivery (Presentation: ; Occiput Anterior). APGAR: 9, 9; weight pending .  Placenta status: Intact, Spontaneous. Cord: 3 vessels with the following complications: .  Anesthesia: Epidural  Episiotomy: None  Lacerations: 1st degree Right and left labial, hemostatic, not repaired  Suture Repair: n/a  Est. Blood Loss (mL): 550  Mom to postpartum. Baby to Couplet care / Skin to Skin.  [id number]  admitted for SOL progressed within a few hours from admission to complete, felt a strong urge to push, and delivered female infant in 3 pushes. FOB assisted with delivery after anterior shoulder delivered, per pt request. Perineum mostly intact with 2 small labial tears that were hemostatic and not repaired. Infant placed in mother's arms, cord clamping delayed by a few minutes. Cord clamped by CNM and cut by FOB. Placenta intact, delivered spontaneously with light cord traction. Pitocin bolus via IV following delivery of placenta. Uterus boggy following  delivery, requiring bimanual exam/massage and removal of large clots from cervical os. Bleeding slowed but continued to be moderate amount so Cytotec 800 mcg placed rectally. Bleeding slowed and mom and baby stable, with skin to skin x 1 hour after delivery.  Gregory, Margaret  05/14/2014, 7:47 PM   Physical Exam:  Filed Vitals:   05/15/14 0220  BP: 110/62  Pulse: 74  Temp: 98.4 F (36.9 C)  Resp: 18    General: alert, cooperative and no distress Lochia: appropriate Uterine Fundus: firm DVT Evaluation: No evidence of DVT seen on physical exam. Negative Homan's sign. No cords or calf tenderness. No significant calf/ankle edema.  Discharge Information: Date: 05/15/2014 Activity: pelvic rest Diet: routine Medications: PNV, Ibuprofen, Colace and Iron Baby feeding: plans to breastfeed Contraception: Vasectomy Condition: stable Instructions: refer to practice specific booklet Discharge to: home   Newborn Data: Live born female  Birth Weight: 7 lb 7.2 oz (3380 g) APGAR: 9, 9  Home with mother.  [name], MD [facility] PGY-2 05/15/2014, 7:48 AM  I examined pt and agree with documentation above and resident plan of care. Eino Farber Kennith Gain, CNM

## 2014-05-15 NOTE — Discharge Summary (Signed)
Attestation of Attending Supervision of Advanced Practitioner (CNM/NP): Evaluation and management procedures were performed by the Advanced Practitioner under my supervision and collaboration.  I have reviewed the Advanced Practitioner's note and chart, and I agree with the management and plan.  Margaret Gregory 05/15/2014 8:48 AM

## 2014-05-15 NOTE — Anesthesia Postprocedure Evaluation (Signed)
Anesthesia Post Note  Patient: Margaret Gregory  Procedure(s) Performed: * No procedures listed *  Anesthesia type: Epidural  Patient location: Mother/Baby  Post pain: Pain level controlled  Post assessment: Post-op Vital signs reviewed  Last Vitals:  Filed Vitals:   05/15/14 0220  BP: 110/62  Pulse: 74  Temp: 36.9 C  Resp: 18    Post vital signs: Reviewed  Level of consciousness:alert  Complications: No apparent anesthesia complications

## 2014-06-28 ENCOUNTER — Ambulatory Visit: Payer: Medicaid Other | Admitting: Obstetrics & Gynecology

## 2014-07-07 ENCOUNTER — Telehealth: Payer: Self-pay | Admitting: *Deleted

## 2014-07-07 ENCOUNTER — Ambulatory Visit: Payer: Medicaid Other | Admitting: Family Medicine

## 2014-07-07 NOTE — Telephone Encounter (Signed)
Margaret Gregory missed her postpartum appointment. Called her and she states she forgot but she does want it rescheduled. Notified her I would have registars call her with new appointment.

## 2014-07-11 ENCOUNTER — Other Ambulatory Visit: Payer: Self-pay | Admitting: Family

## 2014-07-11 ENCOUNTER — Ambulatory Visit (HOSPITAL_COMMUNITY)
Admission: RE | Admit: 2014-07-11 | Discharge: 2014-07-11 | Disposition: A | Discharge status: Home / Self Care | Payer: Medicaid Other | Source: Ambulatory Visit | Attending: Family Medicine | Admitting: Family Medicine

## 2014-07-11 MED ORDER — NYSTATIN 100000 UNIT/GM EX CREA: 1.0000 "application " | TOPICAL_CREAM | Freq: Two times a day (BID) | CUTANEOUS | Status: AC

## 2014-07-11 NOTE — Lactation Note (Signed)
Lactation Consult Baby's Name: Margaret Gregory Gregory  Date of Birth: 05/14/2016  [facility] Gender: female  Gestational Age: 893w3d (At Birth)  Birth Weight: 7 lb 7.2 oz (3380 g)  Weight at Discharge: Weight: 7 lb 7.2 oz (3380 g) (Filed from Delivery Summary) Date of Discharge: 05/15/2014  [facility]   05/14/14 1853  Weight: 7 lb 7.2 oz (3380 g)    Mother's reason for visit:  Mastitis, latch issues, possible yeast Visit Type:  o/p Appointment Notes:  Mom here for evaluation of persistent sore nipples, s/p mastitis, has been on antibiotics for 4 days. Mom states she is feeling better since starting the antibiotics. However, baby is not latching well and mom states baby leaves a flat line across the nipple after feeding. Mom states left breast is still tender, nipple is red. Right side is comfortable.   Mom breastfed her older child, now 15 months, for a year without difficulty. States that this baby, Margaret Gregory, does not flare her top lip out like her son did, and that she always clicks while feeding.   Baby's oral assessment shows an upper lip frenulum that extends below the bottom of the upper gum. Margaret moves her tongue out very well, but the tongue does not lift well off the floor of the mouth. Margaret does not have a rhythmic suck with a waving motion of the tongue. Margaret's tongue humps and pushes out.   Mom latched Margaret to the right breast (the less sore side). Margaret clicks throughout the feeding, upper lip does not fold out. Initiated nipple shield to try to help baby maintain her suction. Nipple shield was helpful and baby latched better for 15 minutes. Transferred baby to the left side (the side that had been treated for mastitis, this breast is still red and tender), baby refused nipple shield, but latched without it. There is a line of compression on the nipple after feeding.   Given the baby's oral findings and the mother's persistent nipple pain, and the  fact that the baby does not have strong, rhythmic suction, I recommend further evaluation for lip and tongue tie. Discussed findings and resources with mom.    Consult:  Initial Lactation Consultant:  Lenard ForthSanders, Maleeya Peterkin Fulmer  ________________________________________________________________________    ________________________________________________________________________  Mother's Name: Brenton GrillsAshley Yusupov Type of delivery:   Breastfeeding Experience:  12 months  Maternal Medical Conditions:  none Maternal Medications:  Antibiotics for mastitis  ________________________________________________________________________  Breastfeeding History (Post Discharge)  Frequency of breastfeeding:  On demand, every 2 to 3 hours Duration of feeding:  15-20 minutes    Pumping  Type of pump:  Unknown Frequency:  1 to 2 times per day Volume:  5 ounces  Infant Intake and Output Assessment  Voids:  6-8 in 24 hrs.  Color:  Clear yellow Stools:  4-6 in 24 hrs.  Color:  Yellow  ________________________________________________________________________  Maternal Breast Assessment  Breast:  Full Nipple:  Erect Pain level:  1 Pain interventions:  Nipple shield  _______________________________________________________________________ Feeding Assessment/Evaluation  Initial feeding assessment:  Infant's oral assessment:  Variance see note above  Positioning:  Cross cradle Right breast  LATCH documentation:  Latch:  1 = Repeated attempts needed to sustain latch, nipple held in mouth throughout feeding, stimulation needed to elicit sucking reflex.  Audible swallowing:  2 = Spontaneous and intermittent  Type of nipple:  2 = Everted at rest and after stimulation  Comfort (Breast/Nipple):  0 = Engorged, cracked, bleeding, large blisters, severe discomfort  Hold (Positioning):  2 = No assistance needed to correctly position infant at breast  LATCH score: 7  Attached assessment:  Deep  Lips  flanged:  No.  Lips untucked:  No.  Suck assessment:  Displays both  Tools:  Nipple shield 24 mm Instructed on use and cleaning of tool:  Yes.    Pre-feed weight:  5368 g  (11 lb. 13.3 oz.) Post-feed weight:  5512 g Amount transferred:  114 ml Amount supplemented:  0 ml  No

## 2014-07-11 NOTE — Progress Notes (Signed)
Patient came by front office stating she was recently treated for mastitis and now has a yeast infection on her breast would like something called in.  Spoke with Rochele PagesWalidah Karim who prescribed nystatin for patient. Informed patient of cream being sent to [facility] pharmacy. Patient verbalizes understanding and has no other questions

## 2014-07-17 ENCOUNTER — Encounter (HOSPITAL_COMMUNITY): Payer: Self-pay | Admitting: *Deleted

## 2014-08-02 ENCOUNTER — Other Ambulatory Visit (HOSPITAL_COMMUNITY)
Admission: RE | Admit: 2014-08-02 | Discharge: 2014-08-02 | Disposition: A | Discharge status: Home / Self Care | Payer: Medicaid Other | Source: Ambulatory Visit | Attending: Obstetrics and Gynecology | Admitting: Obstetrics and Gynecology

## 2014-08-02 ENCOUNTER — Encounter: Payer: Self-pay | Admitting: Obstetrics and Gynecology

## 2014-08-02 ENCOUNTER — Ambulatory Visit (INDEPENDENT_AMBULATORY_CARE_PROVIDER_SITE_OTHER): Payer: Medicaid Other | Admitting: Obstetrics and Gynecology

## 2014-08-02 VITALS — BP 120/80 | HR 62 | Temp 98.1°F | Wt 129.3 lb

## 2014-08-02 DIAGNOSIS — Z01419 Encounter for gynecological examination (general) (routine) without abnormal findings: Secondary | ICD-10-CM

## 2014-08-02 DIAGNOSIS — Z1151 Encounter for screening for human papillomavirus (HPV): Secondary | ICD-10-CM

## 2014-08-02 DIAGNOSIS — Z124 Encounter for screening for malignant neoplasm of cervix: Secondary | ICD-10-CM

## 2014-08-02 NOTE — Progress Notes (Signed)
  Subjective:     Margaret Gregory is a 23 y.o. female G2P2 who is here for a comprehensive physical exam. The patient reports no problems. She is sexually active using Vasectomy for birth control. She is currently exclusively breastfeeding her 423 month old child  History   Social History  . Marital Status: Married    Spouse Name: N/A    Number of Children: N/A  . Years of Education: N/A   Occupational History  . Not on file.   Social History Main Topics  . Smoking status: Never Smoker   . Smokeless tobacco: Never Used  . Alcohol Use: No  . Drug Use: No  . Sexual Activity: Yes    Birth Control/ Protection: Other-see comments     Comment: Husband has vasectomy    Other Topics Concern  . Not on file   Social History Narrative   Health Maintenance  Topic Date Due  . PAP SMEAR  08/13/2007  . INFLUENZA VACCINE  04/15/2014  . TETANUS/TDAP  02/23/2024   History reviewed. No pertinent past medical history. Past Surgical History  Procedure Laterality Date  . Cholecystectomy N/A 04/12/2013    Procedure: LAPAROSCOPIC CHOLECYSTECTOMY WITH INTRAOPERATIVE CHOLANGIOGRAM;  Surgeon: Ernestene MentionHaywood M Ingram, MD;  Location: [facility];  Service: General;  Laterality: N/A;  . Eus N/A 04/13/2013    Procedure: ESOPHAGEAL ENDOSCOPIC ULTRASOUND (EUS) RADIAL;  Surgeon: Theda BelfastPatrick D Hung, MD;  Location: [facility] END[facility];  Service: Endoscopy;  Laterality: N/A;   Family History  Problem Relation Age of Onset  . Cancer Maternal Uncle     lymphoma  . Cancer Maternal Grandmother     bresat       Review of Systems A comprehensive review of systems was negative.   Objective:      GENERAL: Well-developed, well-nourished female in no acute distress.  HEENT: Normocephalic, atraumatic. Sclerae anicteric.  NECK: Supple. Normal thyroid.  LUNGS: Clear to auscultation bilaterally.  HEART: Regular rate and rhythm. BREASTS: Symmetric in size. No palpable masses or lymphadenopathy, skin changes, or nipple  drainage. ABDOMEN: Soft, nontender, nondistended. No organomegaly. PELVIC: Normal external female genitalia. Vagina is erythematous.  Clear vaginal discharge. Normal appearing cervix. Uterus is normal in size. No adnexal mass or tenderness. EXTREMITIES: No cyanosis, clubbing, or edema, 2+ distal pulses.    Assessment:    Healthy female exam.      Plan:    pap smear collected Wet prep collected Patient encouraged to continue breastfeeding and to take prenatal vitamins Patient will be contacted with any abnormal results See After Visit Summary for Counseling Recommendations

## 2014-08-02 NOTE — Progress Notes (Signed)
C/o of vaginal pain when having sex-- states "it feels like something is herniated."

## 2014-08-03 LAB — WET PREP, GENITAL
CLUE CELLS WET PREP: NONE SEEN
Trich, Wet Prep: NONE SEEN
WBC WET PREP: NONE SEEN
YEAST WET PREP: NONE SEEN

## 2014-08-03 LAB — CYTOLOGY - PAP

## 2015-07-03 IMAGING — US US ABDOMEN COMPLETE
1 series · 14 of 25 positions shown · non-contrast
Comparison: None.

CLINICAL DATA: Nausea, vomiting, epigastric pain, elevated liver
function tests

ABDOMINAL ULTRASOUND COMPLETE

[Series 1: us abdomen complete · 14 of 79 slices shown]
[im 1/79]
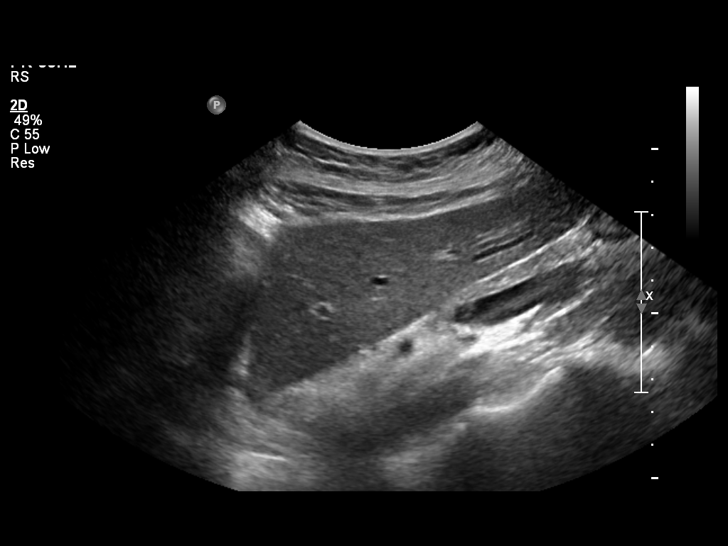
[im 7/79]
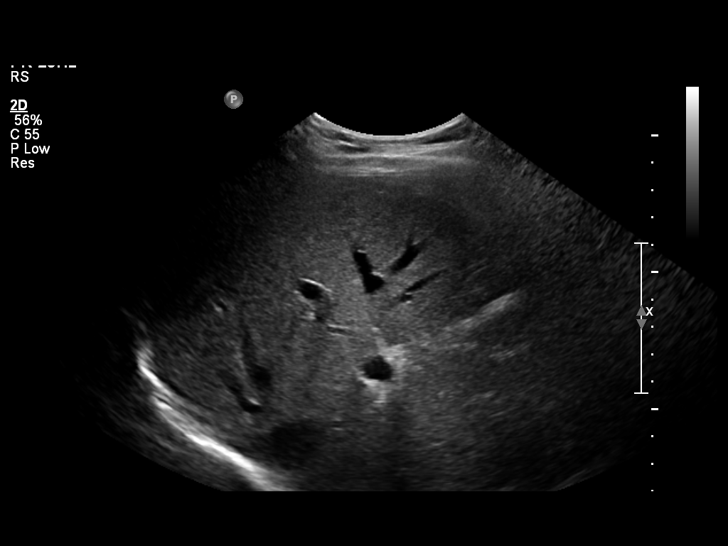
[im 14/79]
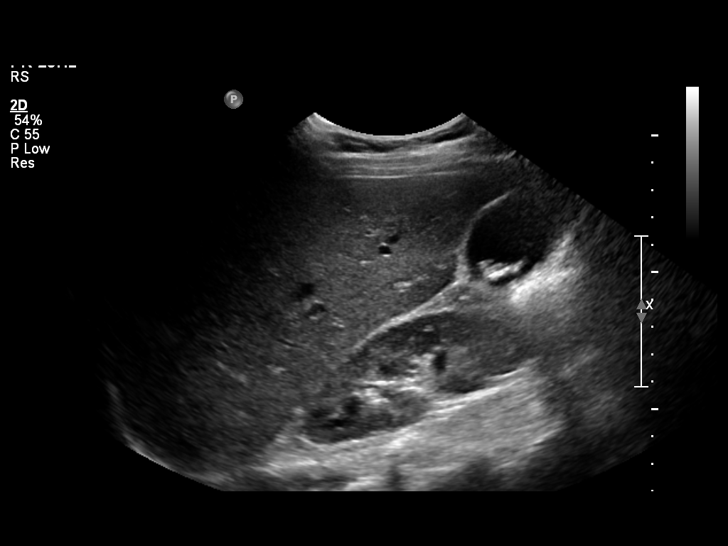
[im 20/79]
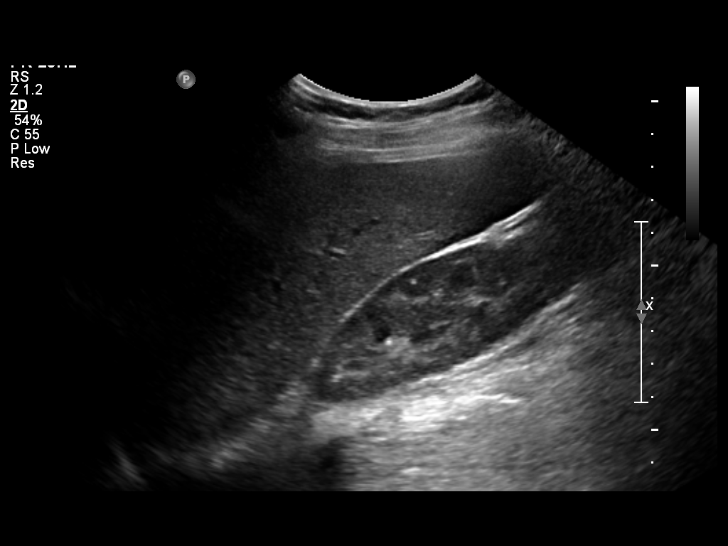
[im 27/79]
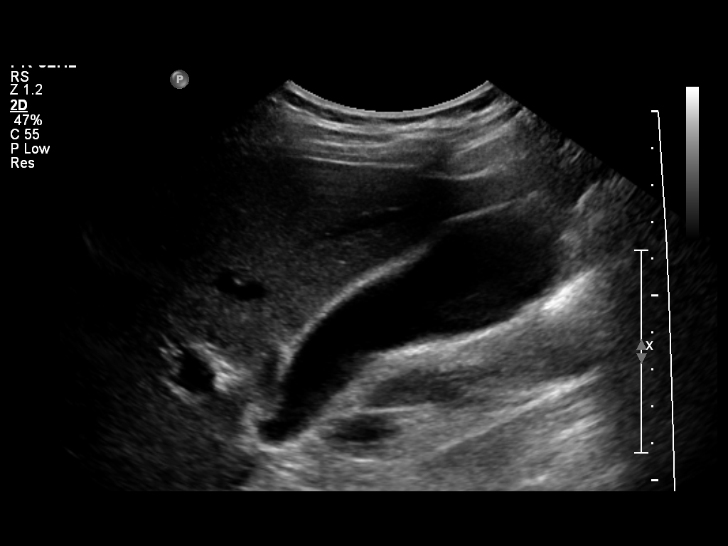
[im 30/79]
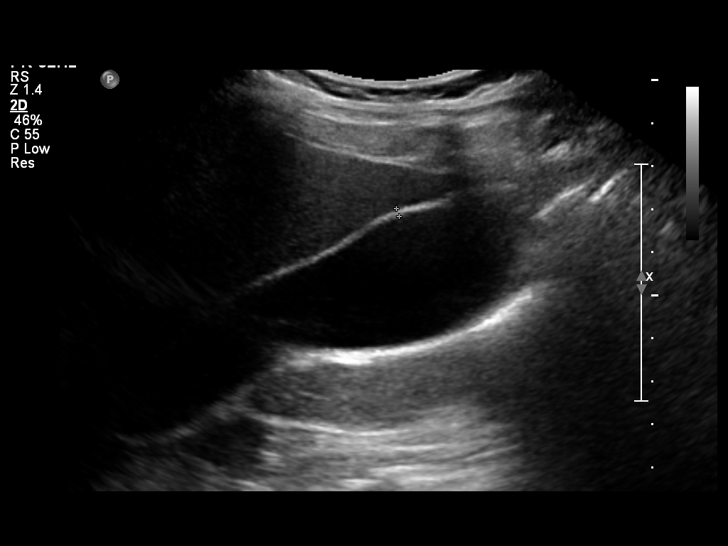
[im 36/79]
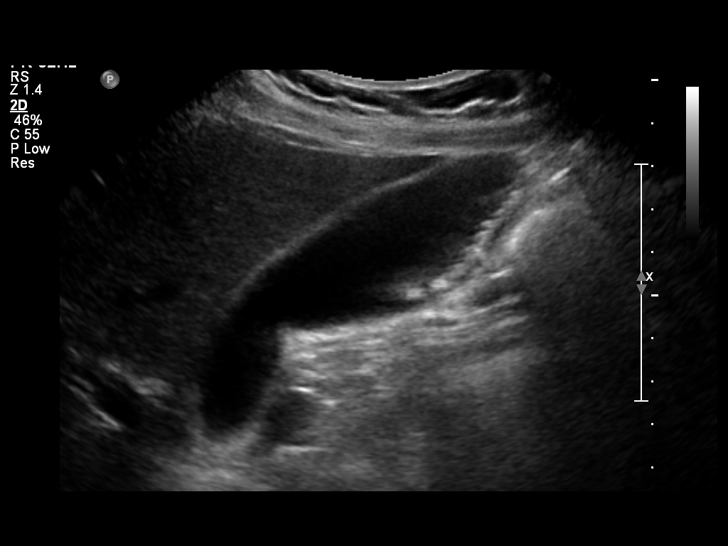
[im 43/79]
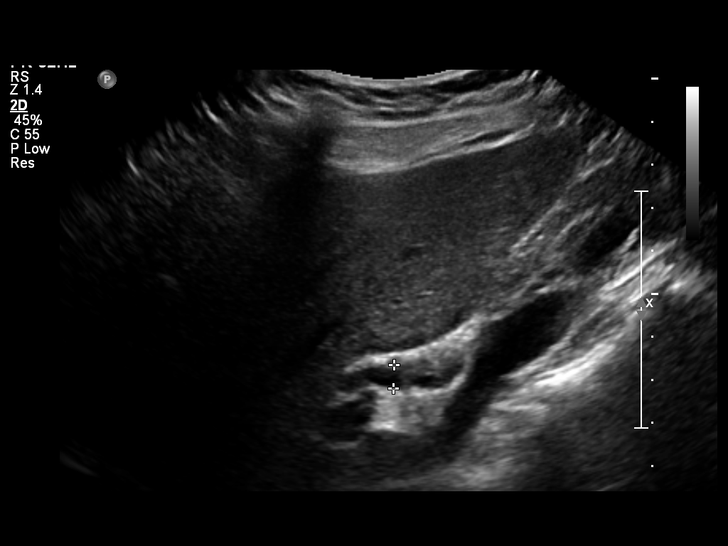
[im 49/79]
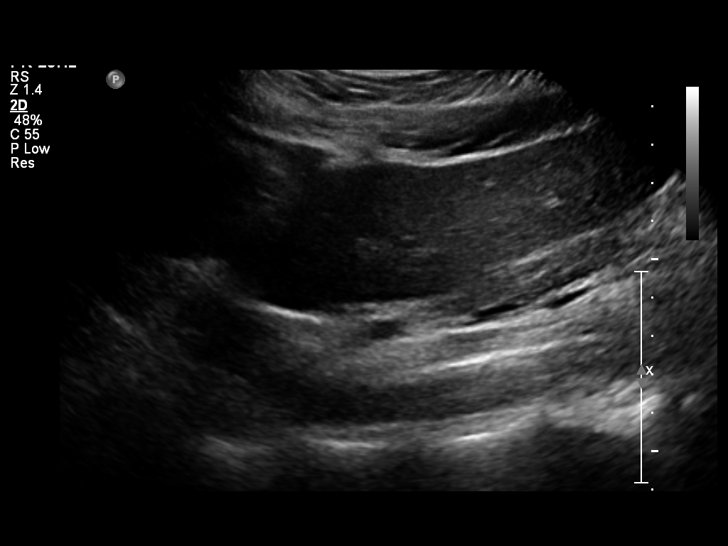
[im 53/79]
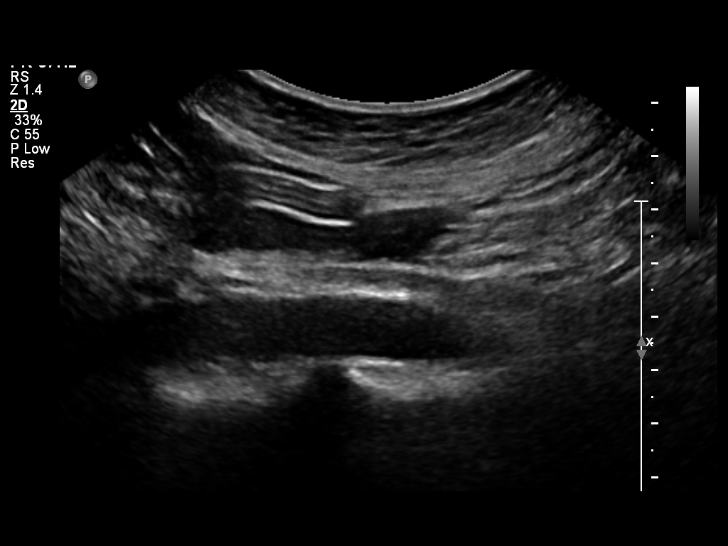
[im 59/79]
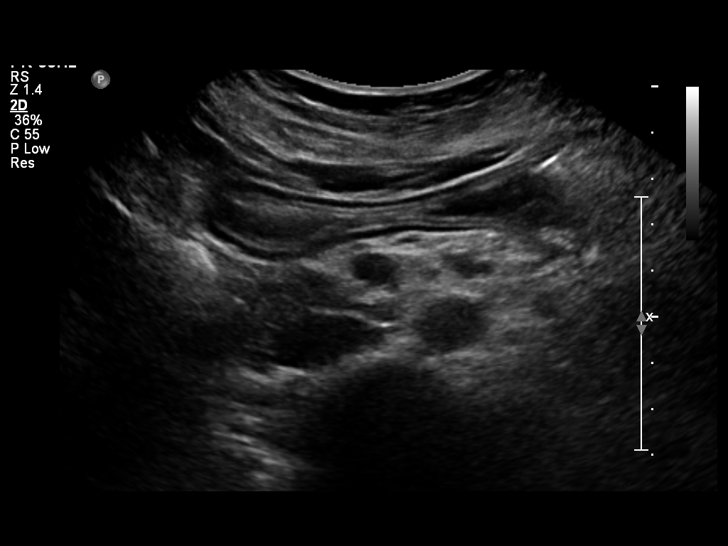
[im 66/79]
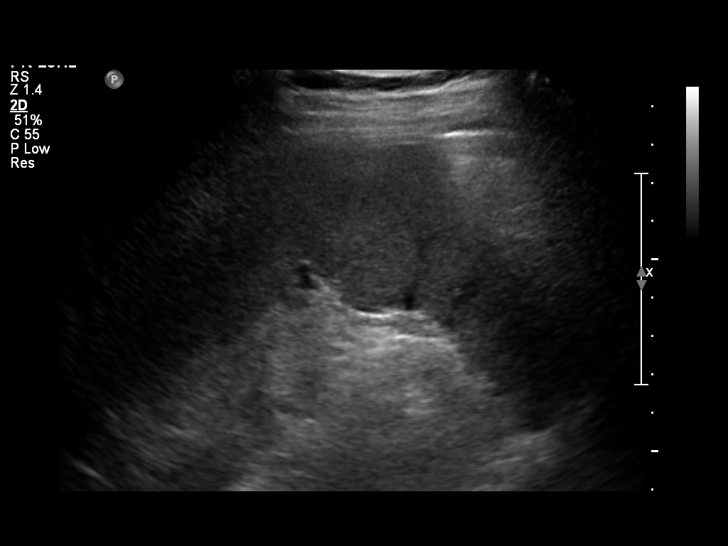
[im 72/79]
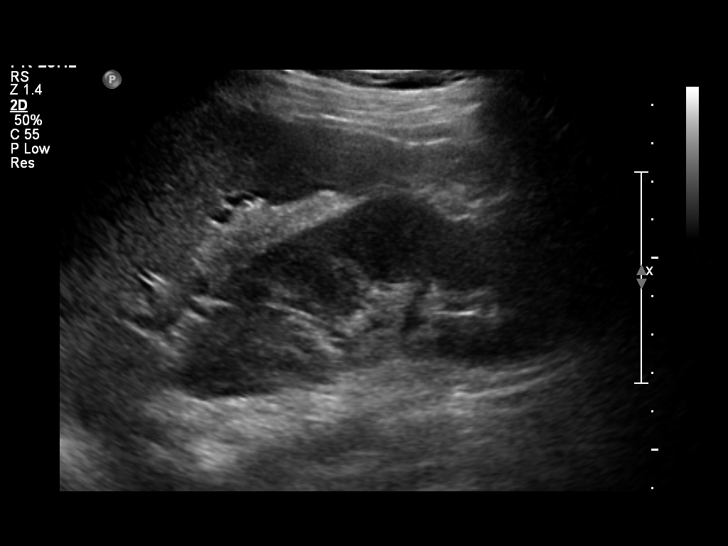
[im 79/79]
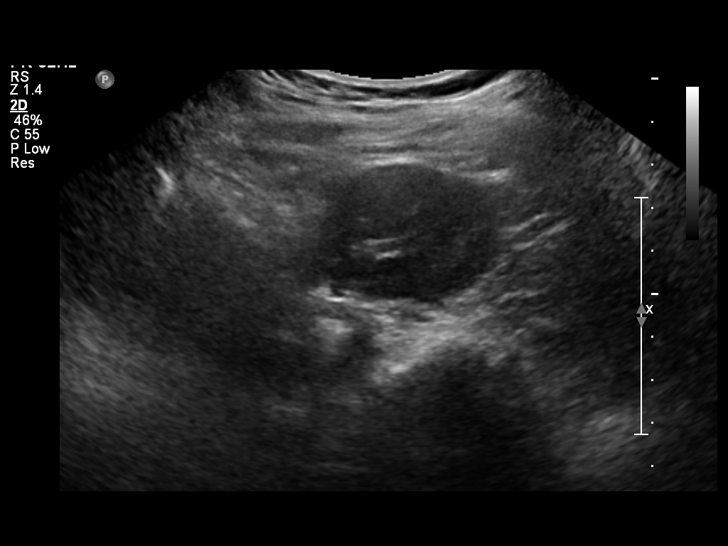

[14 of 25 positions shown; findings below may reference images not displayed]

FINDINGS: Gallbladder:  Dependent gallstones noted without gallbladder wall
thickening, pericholecystic fluid, or sonographic Murphy's sign.

Common Bile Duct:  Upper limits of normal, 6 mm.  Without
intrahepatic ductal filling defect identified.

Liver: No focal mass lesion identified.  Within normal limits in
parenchymal echogenicity.

IVC:  Appears normal.

Pancreas:  No abnormality identified.

Spleen:  Within normal limits in size and echotexture.

Right kidney:  Normal in size and parenchymal echogenicity.  No
evidence of mass or hydronephrosis.

Left kidney:  Normal in size and parenchymal echogenicity.  No
evidence of mass or hydronephrosis.

Abdominal Aorta:  No aneurysm identified.
IMPRESSION: Gallstones without other sonographic evidence for acute
cholecystitis.

## 2015-12-29 IMAGING — US US OB COMP LESS 14 WK
1 series · 14 of 28 positions shown · non-contrast
Comparison: None.

CLINICAL DATA: Pregnant, unknown LMP

EXAM:
OBSTETRIC <14 WK ULTRASOUND
TECHNIQUE: Transabdominal ultrasound was performed for evaluation of the
gestation as well as the maternal uterus and adnexal regions.

[Series 1: us ob comp less 14 wks · 32 acquisitions, 14 frames shown]
[im 2/32]
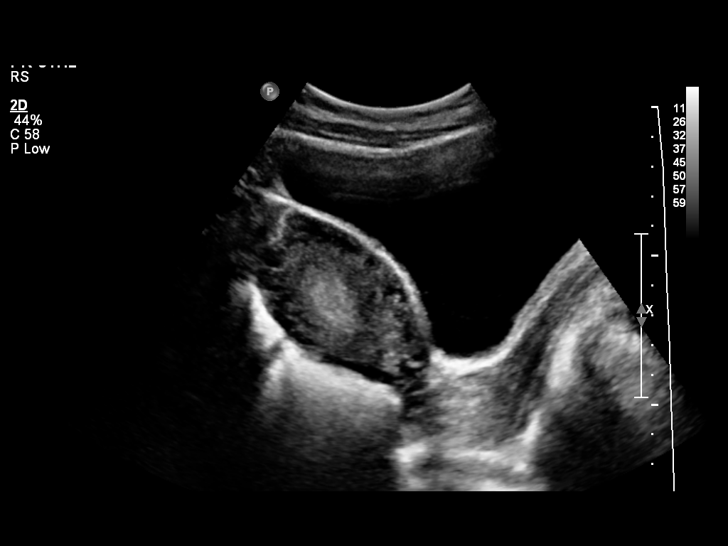
[im 4/32]
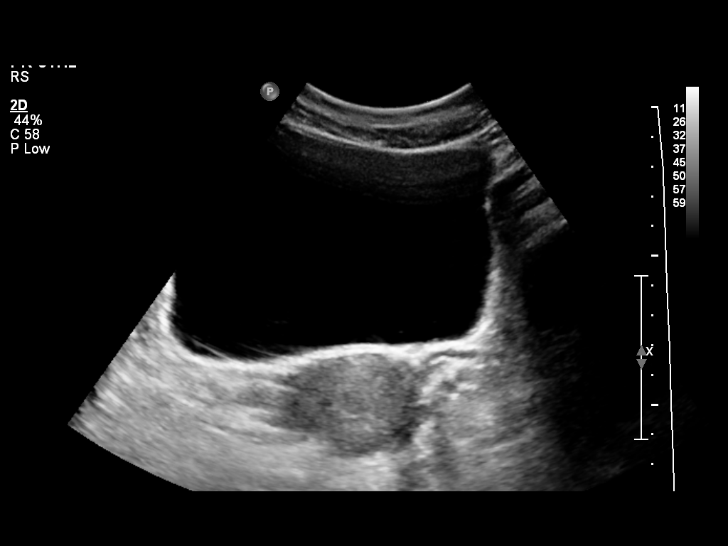
[im 6/32]
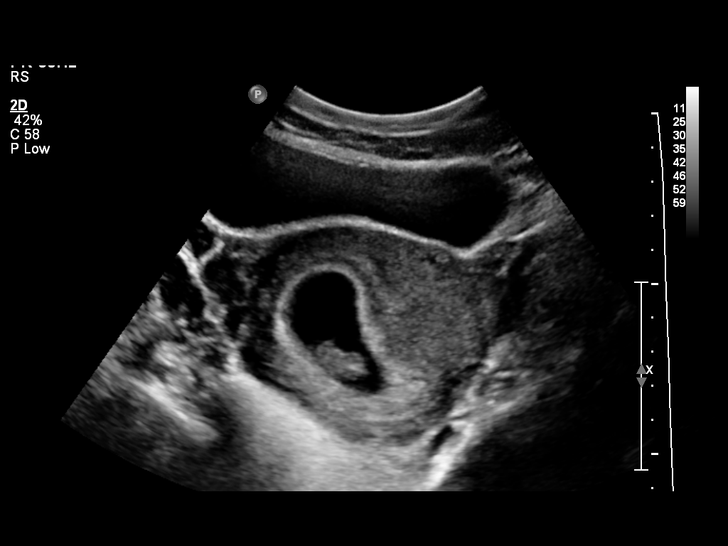
[im 9/32]
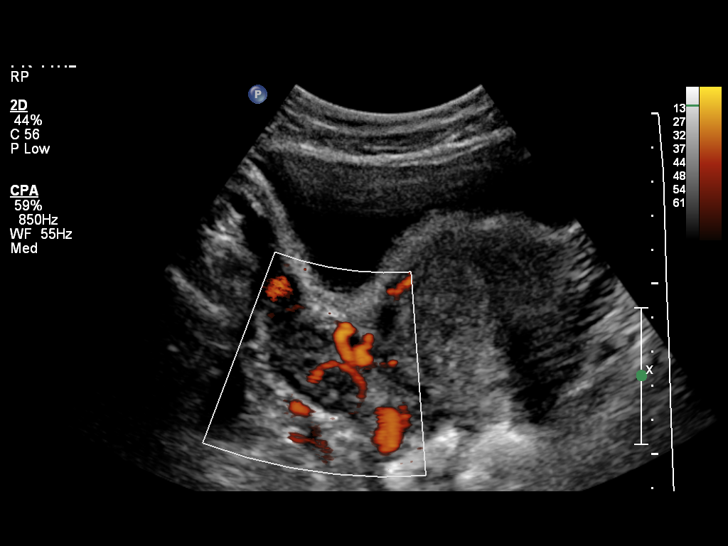
[im 11/32]
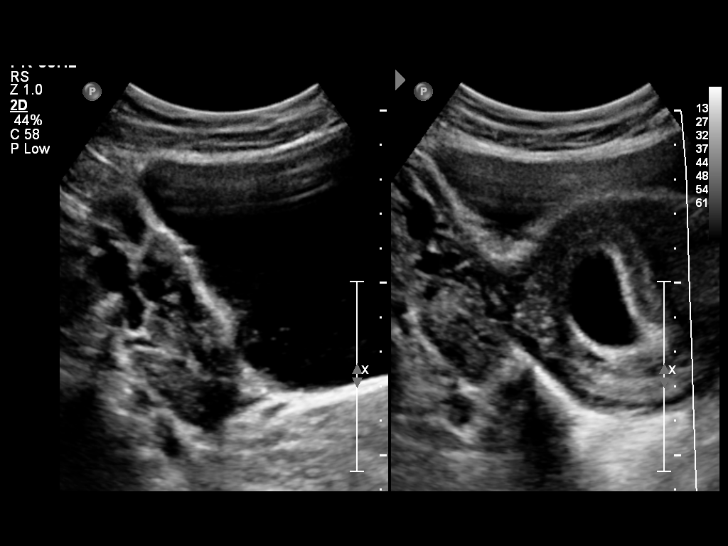
[im 13/32]
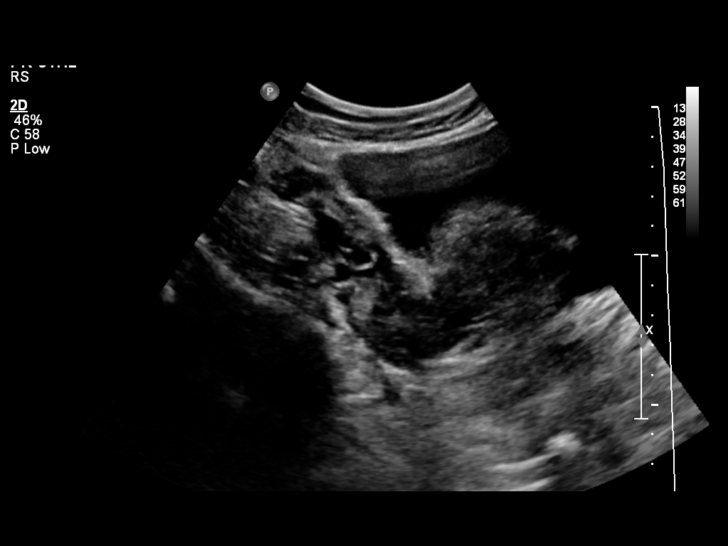
[im 15/32]
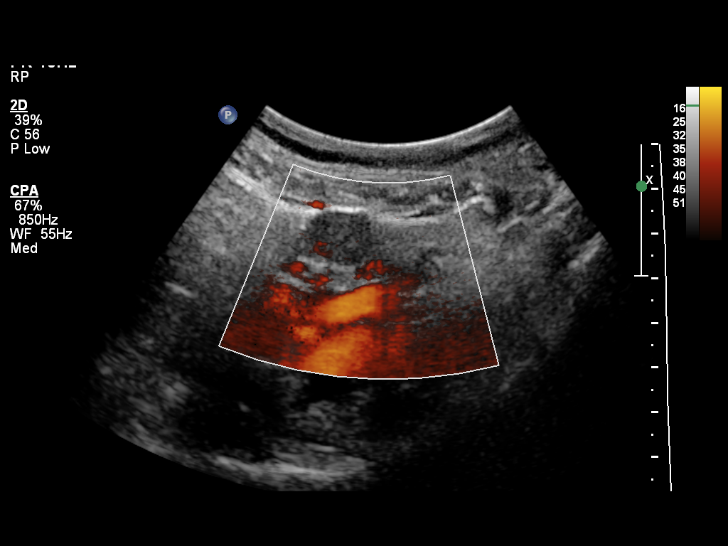
[im 18/32]
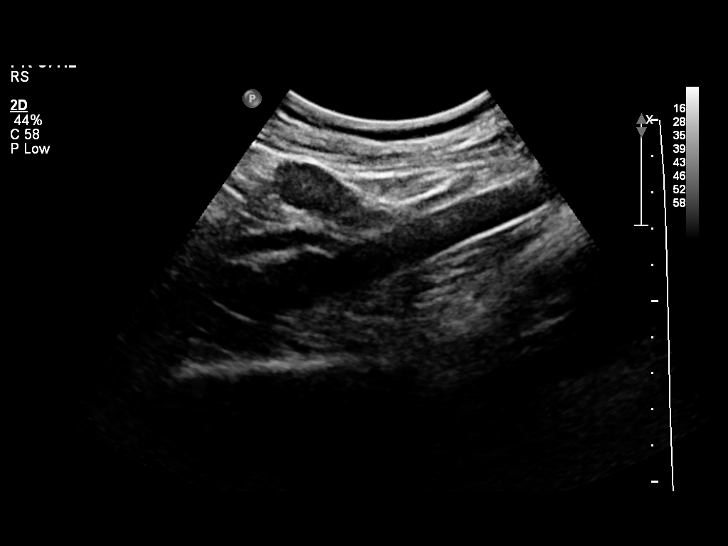
[im 20/32]
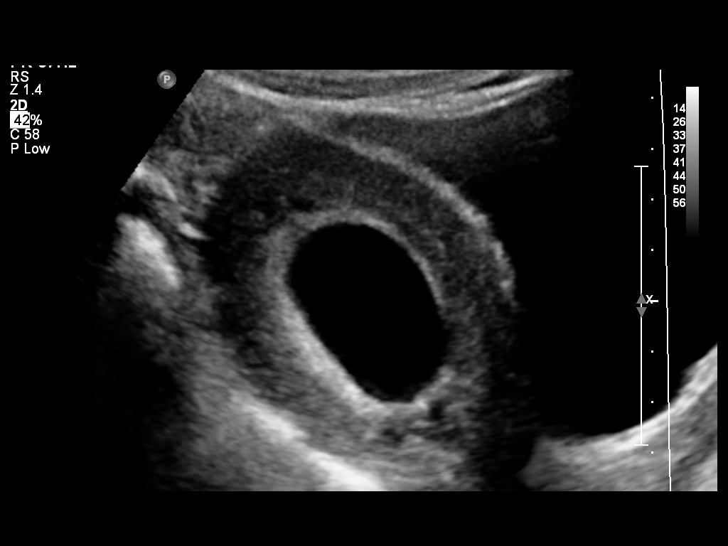
[im 22/32]
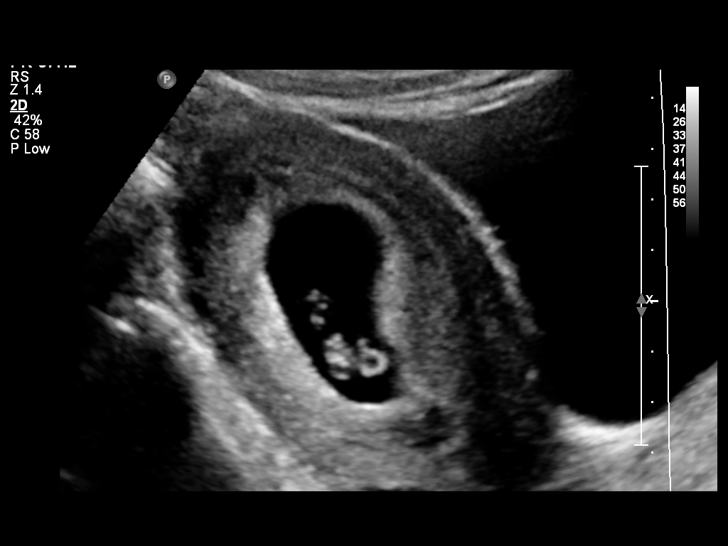
[im 25/32]
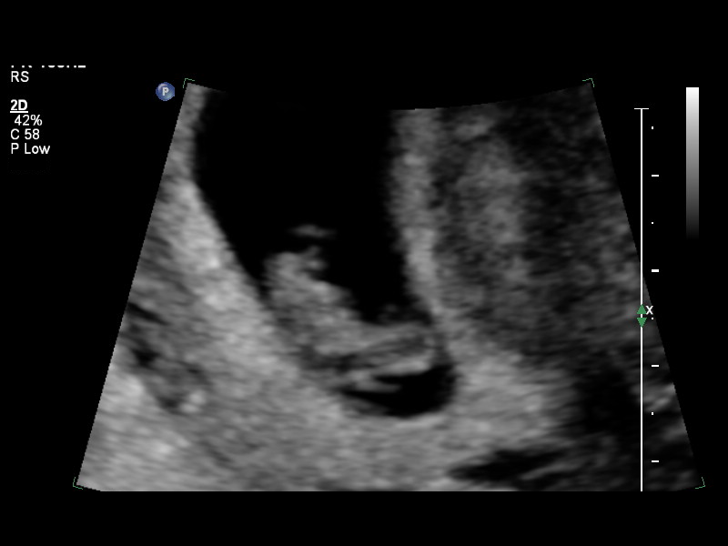
[im 27/32]
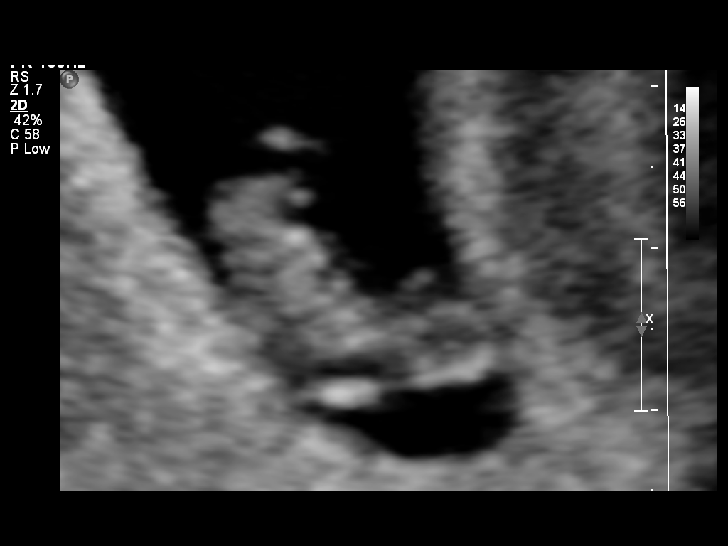
[im 29/32]
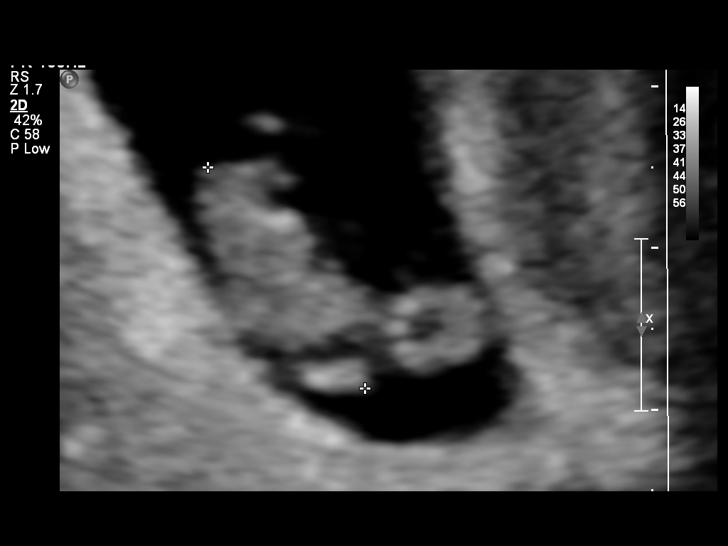
[im 32/32]
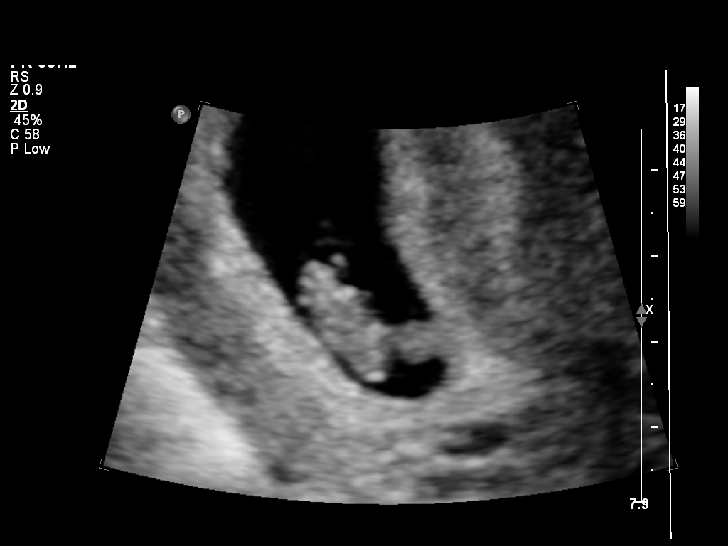

[14 of 28 positions shown; findings below may reference images not displayed]

FINDINGS: Intrauterine gestational sac: Visualized/normal in shape.

Yolk sac:  Present

Embryo:  Present

Cardiac Activity: Present

Heart Rate: 169 bpm

CRL:   16.6  mm   8 w 1 d                  US EDC: 05/18/2014

Maternal uterus/adnexae: Small subchorionic hemorrhage.

Bilateral ovaries are unremarkable.

No pelvic ascites.
IMPRESSION: Single live intrauterine gestation with estimated gestational age 8
weeks 1 day by crown-rump length.

## 2022-03-21 ENCOUNTER — Emergency Department (HOSPITAL_BASED_OUTPATIENT_CLINIC_OR_DEPARTMENT_OTHER)
Admission: EM | Admit: 2022-03-21 | Discharge: 2022-03-21 | Disposition: A | Discharge status: Home / Self Care | Payer: No Typology Code available for payment source | Attending: Emergency Medicine | Admitting: Emergency Medicine

## 2022-03-21 ENCOUNTER — Encounter (HOSPITAL_BASED_OUTPATIENT_CLINIC_OR_DEPARTMENT_OTHER): Payer: Self-pay

## 2022-03-21 ENCOUNTER — Emergency Department (HOSPITAL_BASED_OUTPATIENT_CLINIC_OR_DEPARTMENT_OTHER): Payer: No Typology Code available for payment source | Admitting: Radiology

## 2022-03-21 DIAGNOSIS — Y92410 Unspecified street and highway as the place of occurrence of the external cause: Secondary | ICD-10-CM | POA: Insufficient documentation

## 2022-03-21 DIAGNOSIS — M25562 Pain in left knee: Secondary | ICD-10-CM | POA: Insufficient documentation

## 2022-03-21 DIAGNOSIS — S50812A Abrasion of left forearm, initial encounter: Secondary | ICD-10-CM | POA: Insufficient documentation

## 2022-03-21 DIAGNOSIS — S60512A Abrasion of left hand, initial encounter: Secondary | ICD-10-CM | POA: Diagnosis not present

## 2022-03-21 DIAGNOSIS — S6992XA Unspecified injury of left wrist, hand and finger(s), initial encounter: Secondary | ICD-10-CM | POA: Diagnosis present
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
## 2022-03-21 NOTE — Discharge Instructions (Signed)
Follow-up with your primary care doctor.  Take Tylenol Motrin as needed for pain control.  If you are having any persistent pain, numb or tingling sensation in your hand, please follow-up with a hand specialist provided in the discharge paperwork.
